# Patient Record
Sex: Male | Born: 1950
Health system: Southern US, Community
[De-identification: ages and names within clinical notes are randomized; demographics above are authoritative.]

## PROBLEM LIST (undated history)

## (undated) DIAGNOSIS — B070 Plantar wart: Secondary | ICD-10-CM

## (undated) DIAGNOSIS — M545 Low back pain, unspecified: Secondary | ICD-10-CM

## (undated) DIAGNOSIS — I6529 Occlusion and stenosis of unspecified carotid artery: Secondary | ICD-10-CM

## (undated) DIAGNOSIS — I712 Thoracic aortic aneurysm, without rupture: Secondary | ICD-10-CM

## (undated) DIAGNOSIS — I10 Essential (primary) hypertension: Secondary | ICD-10-CM

## (undated) DIAGNOSIS — R6882 Decreased libido: Secondary | ICD-10-CM

## (undated) DIAGNOSIS — M719 Bursopathy, unspecified: Secondary | ICD-10-CM

## (undated) DIAGNOSIS — Z136 Encounter for screening for cardiovascular disorders: Secondary | ICD-10-CM

## (undated) DIAGNOSIS — E7800 Pure hypercholesterolemia, unspecified: Secondary | ICD-10-CM

## (undated) DIAGNOSIS — J449 Chronic obstructive pulmonary disease, unspecified: Secondary | ICD-10-CM

## (undated) HISTORY — DX: Low back pain: M54.5

## (undated) HISTORY — DX: Encounter for screening for cardiovascular disorders: Z13.6

## (undated) HISTORY — DX: Occlusion and stenosis of unspecified carotid artery: I65.29

## (undated) HISTORY — DX: Chronic obstructive pulmonary disease, unspecified: J44.9

## (undated) HISTORY — DX: Low back pain, unspecified: M54.50

## (undated) HISTORY — DX: Pure hypercholesterolemia, unspecified: E78.00

## (undated) HISTORY — DX: Plantar wart: B07.0

## (undated) HISTORY — DX: Decreased libido: R68.82

## (undated) HISTORY — DX: Essential (primary) hypertension: I10

## (undated) HISTORY — DX: Bursopathy, unspecified: M71.9

## (undated) HISTORY — DX: Thoracic aortic aneurysm, without rupture: I71.2

---

## 2003-04-02 ENCOUNTER — Encounter: Admission: RE | Admit: 2003-04-02 | Discharge: 2003-04-02 | Payer: Self-pay | Admitting: Family Medicine

## 2003-04-02 ENCOUNTER — Encounter: Payer: Self-pay | Admitting: Family Medicine

## 2006-06-29 ENCOUNTER — Ambulatory Visit: Payer: Self-pay | Admitting: Family Medicine

## 2006-06-29 LAB — CONVERTED CEMR LAB
ALT: 24 units/L (ref 0–40)
AST: 21 units/L (ref 0–37)
BUN: 12 mg/dL (ref 6–23)
CO2: 29 meq/L (ref 19–32)
Calcium: 9.5 mg/dL (ref 8.4–10.5)
Chloride: 104 meq/L (ref 96–112)
Cholesterol: 245 mg/dL (ref 0–200)
Creatinine, Ser: 0.9 mg/dL (ref 0.4–1.5)
GFR calc Af Amer: 113 mL/min
GFR calc non Af Amer: 93 mL/min
Glucose, Bld: 86 mg/dL (ref 70–99)
HDL: 38.5 mg/dL — ABNORMAL LOW (ref >39.0)
LDL Cholesterol: 183 mg/dL — ABNORMAL HIGH (ref 0–99)
Potassium: 4.6 meq/L (ref 3.5–5.1)
Sodium: 140 meq/L (ref 135–145)
Total CHOL/HDL Ratio: 6.4
Triglycerides: 118 mg/dL (ref 0–149)
VLDL: 24 mg/dL (ref 0–40)

## 2006-08-03 ENCOUNTER — Ambulatory Visit: Payer: Self-pay | Admitting: Family Medicine

## 2006-08-03 LAB — CONVERTED CEMR LAB
BUN: 14 mg/dL (ref 6–23)
CO2: 30 meq/L (ref 19–32)
Calcium: 9.6 mg/dL (ref 8.4–10.5)
Chloride: 103 meq/L (ref 96–112)
Creatinine, Ser: 1 mg/dL (ref 0.4–1.5)
GFR calc Af Amer: 100 mL/min
GFR calc non Af Amer: 83 mL/min
Glucose, Bld: 105 mg/dL — ABNORMAL HIGH (ref 70–99)
Potassium: 4.6 meq/L (ref 3.5–5.1)
Sodium: 139 meq/L (ref 135–145)

## 2006-09-04 ENCOUNTER — Ambulatory Visit: Payer: Self-pay | Admitting: Family Medicine

## 2006-09-04 LAB — CONVERTED CEMR LAB
BUN: 18 mg/dL (ref 6–23)
CO2: 29 meq/L (ref 19–32)
Calcium: 9.5 mg/dL (ref 8.4–10.5)
Chloride: 105 meq/L (ref 96–112)
Creatinine, Ser: 0.9 mg/dL (ref 0.4–1.5)
GFR calc Af Amer: 113 mL/min
GFR calc non Af Amer: 93 mL/min
Glucose, Bld: 106 mg/dL — ABNORMAL HIGH (ref 70–99)
Potassium: 4.6 meq/L (ref 3.5–5.1)
Sodium: 142 meq/L (ref 135–145)

## 2006-10-03 ENCOUNTER — Ambulatory Visit: Payer: Self-pay | Admitting: Family Medicine

## 2006-10-03 LAB — CONVERTED CEMR LAB
ALT: 25 units/L (ref 0–40)
AST: 21 units/L (ref 0–37)
Cholesterol: 179 mg/dL (ref 0–200)
HDL: 38.2 mg/dL — ABNORMAL LOW (ref >39.0)
LDL Cholesterol: 117 mg/dL — ABNORMAL HIGH (ref 0–99)
Total CHOL/HDL Ratio: 4.7
Triglycerides: 117 mg/dL (ref 0–149)
VLDL: 23 mg/dL (ref 0–40)

## 2007-01-01 ENCOUNTER — Encounter (INDEPENDENT_AMBULATORY_CARE_PROVIDER_SITE_OTHER): Payer: Self-pay | Admitting: Family Medicine

## 2007-01-01 ENCOUNTER — Encounter: Payer: Self-pay | Admitting: Family Medicine

## 2007-01-01 ENCOUNTER — Ambulatory Visit: Payer: Self-pay | Admitting: Family Medicine

## 2007-01-01 DIAGNOSIS — I10 Essential (primary) hypertension: Secondary | ICD-10-CM | POA: Insufficient documentation

## 2007-01-01 DIAGNOSIS — E785 Hyperlipidemia, unspecified: Secondary | ICD-10-CM | POA: Insufficient documentation

## 2007-01-01 DIAGNOSIS — F17210 Nicotine dependence, cigarettes, uncomplicated: Secondary | ICD-10-CM | POA: Insufficient documentation

## 2007-03-20 ENCOUNTER — Ambulatory Visit: Payer: Self-pay | Admitting: Family Medicine

## 2007-03-22 ENCOUNTER — Telehealth (INDEPENDENT_AMBULATORY_CARE_PROVIDER_SITE_OTHER): Payer: Self-pay | Admitting: *Deleted

## 2007-03-22 ENCOUNTER — Encounter (INDEPENDENT_AMBULATORY_CARE_PROVIDER_SITE_OTHER): Payer: Self-pay | Admitting: *Deleted

## 2007-03-22 LAB — CONVERTED CEMR LAB
ALT: 22 units/L (ref 0–53)
AST: 21 units/L (ref 0–37)
BUN: 14 mg/dL (ref 6–23)
CO2: 32 meq/L (ref 19–32)
Calcium: 9.8 mg/dL (ref 8.4–10.5)
Chloride: 105 meq/L (ref 96–112)
Cholesterol: 196 mg/dL (ref 0–200)
Creatinine, Ser: 0.9 mg/dL (ref 0.4–1.5)
GFR calc Af Amer: 113 mL/min
GFR calc non Af Amer: 93 mL/min
Glucose, Bld: 99 mg/dL (ref 70–99)
HDL: 30.2 mg/dL — ABNORMAL LOW (ref >39.0)
LDL Cholesterol: 142 mg/dL — ABNORMAL HIGH (ref 0–99)
Potassium: 4.7 meq/L (ref 3.5–5.1)
Sodium: 143 meq/L (ref 135–145)
Total CHOL/HDL Ratio: 6.5
Triglycerides: 121 mg/dL (ref 0–149)
VLDL: 24 mg/dL (ref 0–40)

## 2007-04-11 ENCOUNTER — Telehealth (INDEPENDENT_AMBULATORY_CARE_PROVIDER_SITE_OTHER): Payer: Self-pay | Admitting: *Deleted

## 2007-05-17 ENCOUNTER — Telehealth (INDEPENDENT_AMBULATORY_CARE_PROVIDER_SITE_OTHER): Payer: Self-pay | Admitting: *Deleted

## 2007-06-27 ENCOUNTER — Ambulatory Visit: Payer: Self-pay | Admitting: Family Medicine

## 2007-06-27 DIAGNOSIS — F4322 Adjustment disorder with anxiety: Secondary | ICD-10-CM | POA: Insufficient documentation

## 2007-06-28 ENCOUNTER — Encounter (INDEPENDENT_AMBULATORY_CARE_PROVIDER_SITE_OTHER): Payer: Self-pay | Admitting: *Deleted

## 2007-06-28 LAB — CONVERTED CEMR LAB
ALT: 29 units/L (ref 0–53)
AST: 24 units/L (ref 0–37)
BUN: 21 mg/dL (ref 6–23)
CO2: 28 meq/L (ref 19–32)
Calcium: 9.6 mg/dL (ref 8.4–10.5)
Chloride: 100 meq/L (ref 96–112)
Cholesterol: 185 mg/dL (ref 0–200)
Creatinine, Ser: 1 mg/dL (ref 0.4–1.5)
GFR calc Af Amer: 100 mL/min
GFR calc non Af Amer: 82 mL/min
Glucose, Bld: 107 mg/dL — ABNORMAL HIGH (ref 70–99)
HDL: 35.5 mg/dL — ABNORMAL LOW (ref >39.0)
LDL Cholesterol: 135 mg/dL — ABNORMAL HIGH (ref 0–99)
Potassium: 4.3 meq/L (ref 3.5–5.1)
Sodium: 138 meq/L (ref 135–145)
Total CHOL/HDL Ratio: 5.2
Triglycerides: 75 mg/dL (ref 0–149)
VLDL: 15 mg/dL (ref 0–40)

## 2007-10-25 ENCOUNTER — Ambulatory Visit: Payer: Self-pay | Admitting: Internal Medicine

## 2007-10-30 ENCOUNTER — Encounter: Payer: Self-pay | Admitting: Internal Medicine

## 2007-11-06 ENCOUNTER — Encounter (INDEPENDENT_AMBULATORY_CARE_PROVIDER_SITE_OTHER): Payer: Self-pay | Admitting: *Deleted

## 2008-02-28 ENCOUNTER — Ambulatory Visit: Payer: Self-pay | Admitting: Internal Medicine

## 2008-08-11 ENCOUNTER — Ambulatory Visit: Payer: Self-pay | Admitting: Internal Medicine

## 2008-08-11 DIAGNOSIS — R059 Cough, unspecified: Secondary | ICD-10-CM | POA: Insufficient documentation

## 2008-08-11 DIAGNOSIS — R05 Cough: Secondary | ICD-10-CM

## 2008-08-14 LAB — CONVERTED CEMR LAB
ALT: 24 units/L (ref 0–53)
AST: 24 units/L (ref 0–37)
Albumin: 4.2 g/dL (ref 3.5–5.2)
Alkaline Phosphatase: 91 units/L (ref 39–117)
BUN: 15 mg/dL (ref 6–23)
Basophils Absolute: 0.1 10*3/uL (ref 0.0–0.1)
Basophils Relative: 0.9 % (ref 0.0–3.0)
Bilirubin, Direct: 0.1 mg/dL (ref 0.0–0.3)
CO2: 29 meq/L (ref 19–32)
Calcium: 9.5 mg/dL (ref 8.4–10.5)
Chloride: 101 meq/L (ref 96–112)
Cholesterol: 207 mg/dL (ref 0–200)
Creatinine, Ser: 0.8 mg/dL (ref 0.4–1.5)
Direct LDL: 150.3 mg/dL
Eosinophils Absolute: 0.2 10*3/uL (ref 0.0–0.7)
Eosinophils Relative: 1.5 % (ref 0.0–5.0)
GFR calc Af Amer: 129 mL/min
GFR calc non Af Amer: 106 mL/min
Glucose, Bld: 84 mg/dL (ref 70–99)
HCT: 46.6 % (ref 39.0–52.0)
HDL: 40.1 mg/dL (ref >39.0)
Hemoglobin: 16.2 g/dL (ref 13.0–17.0)
Lymphocytes Relative: 27.3 % (ref 12.0–46.0)
MCHC: 34.6 g/dL (ref 30.0–36.0)
MCV: 88 fL (ref 78.0–100.0)
Monocytes Absolute: 0.8 10*3/uL (ref 0.1–1.0)
Monocytes Relative: 7 % (ref 3.0–12.0)
Neutro Abs: 7 10*3/uL (ref 1.4–7.7)
Neutrophils Relative %: 63.3 % (ref 43.0–77.0)
Platelets: 261 10*3/uL (ref 150–400)
Potassium: 4.4 meq/L (ref 3.5–5.1)
RBC: 5.3 M/uL (ref 4.22–5.81)
RDW: 13.2 % (ref 11.5–14.6)
Sodium: 139 meq/L (ref 135–145)
Total Bilirubin: 0.7 mg/dL (ref 0.3–1.2)
Total CHOL/HDL Ratio: 5.2
Total Protein: 7.6 g/dL (ref 6.0–8.3)
Triglycerides: 97 mg/dL (ref 0–149)
VLDL: 19 mg/dL (ref 0–40)
WBC: 11.1 10*3/uL — ABNORMAL HIGH (ref 4.5–10.5)

## 2008-09-03 ENCOUNTER — Telehealth (INDEPENDENT_AMBULATORY_CARE_PROVIDER_SITE_OTHER): Payer: Self-pay | Admitting: *Deleted

## 2008-12-22 ENCOUNTER — Ambulatory Visit: Payer: Self-pay | Admitting: Internal Medicine

## 2009-01-20 ENCOUNTER — Telehealth (INDEPENDENT_AMBULATORY_CARE_PROVIDER_SITE_OTHER): Payer: Self-pay | Admitting: *Deleted

## 2009-07-06 ENCOUNTER — Encounter: Admission: RE | Admit: 2009-07-06 | Discharge: 2009-07-06 | Payer: Self-pay | Admitting: Internal Medicine

## 2010-07-11 LAB — CONVERTED CEMR LAB
BUN: 16 mg/dL (ref 6–23)
Cholesterol, target level: 200 mg/dL
Creatinine, Ser: 0.8 mg/dL (ref 0.4–1.5)
HDL goal, serum: 40 mg/dL
LDL Goal: 130 mg/dL
Potassium: 4.4 meq/L (ref 3.5–5.1)

## 2010-07-13 NOTE — Assessment & Plan Note (Signed)
Summary: roa 3 months.cbs   Vital Signs:  Patient Profile:   60 Years Old Male Weight:      189.13 pounds Temp:     98.7 degrees F oral Pulse rate:   72 / minute Resp:     16 per minute BP sitting:   110 / 70  (right arm)  Pt. in pain?   no  Vitals Entered By: Ardyth Man (June 27, 2007 9:04 AM)                  Chief Complaint:  3 month check up BP and Cholesterol.  History of Present Illness: Patient reports he is having. a lot  "anxiety attacks." Dealing with his mother chronic illness. Very stressful life,: Business doing poorly, eating poorly ( 1 meal a day), and traveling to see his mother almost daily. States he suddenly get sense of "anxiety" that overwhelms him. No presyncope, syncopal episodes, chest pain, dizziness, weakness, palpitation, seizure activity.  Last a few seconds and it goes away.  Usually preceded by a stressful situation. It has not happen in several weeks. States  his wife advise him to bring it up, but he doesn't want treatment. States he knows what is causing it.  Hypertension Follow-Up      This is a 60 year old man who presents for Hypertension follow-up.  The patient denies lightheadedness, edema, and fatigue.  The patient denies the following associated symptoms: chest pain, chest pressure, dyspnea, palpitations, syncope, leg edema, and pedal edema.  Compliance with medications (by patient report) has been near 100%.  The patient reports that dietary compliance has been good.  The patient reports no exercise.    Hyperlipidemia Follow-Up      The patient also presents for Hyperlipidemia follow-up.  Compliance with medications (by patient report) has been near 100%.    Current Allergies: ! SULFA  Past Medical History:    Reviewed history from 01/01/2007 and no changes required:       Hyperlipidemia       Hypertension   Social History:    Reviewed history from 01/01/2007 and no changes required:       Retired       Married  Current Smoker   Risk Factors:     Counseled to quit/cut down tobacco use:  yes   Review of Systems  Psych      Complains of anxiety and panic attacks.      Denies alternate hallucination ( auditory/visual), suicidal thoughts/plans, thoughts of violence, unusual visions or sounds, and thoughts /plans of harming others.   Physical Exam  General:     Well-developed,well-nourished,in no acute distress; alert,appropriate and cooperative throughout examination Neck:     No deformities, masses, or tenderness noted. Lungs:     Normal respiratory effort, chest expands symmetrically. Lungs are clear to auscultation, no crackles or wheezes. Heart:     Normal rate and regular rhythm. S1 and S2 normal without gallop, murmur, click, rub or other extra sounds. Pulses:     R and L carotid,radial ,dorsalis pedis and posterior tibial pulses are full and equal bilaterally Extremities:     No clubbing, cyanosis, edema, or deformity noted with normal full range of motion of all joints.      Impression & Recommendations:  Problem # 1:  HYPERTENSION (ICD-401.9) At goal F/u in 4 months His updated medication list for this problem includes:    Hydrochlorothiazide 25 Mg Tabs (Hydrochlorothiazide) .Marland Kitchen... Take 1 tablet by mouth every  morning    Lisinopril 10 Mg Tabs (Lisinopril) .Marland Kitchen... Take one tablet daily  Orders: TLB-BMP (Basic Metabolic Panel-BMET) (80048-METABOL) Venipuncture (62130)  BP today: 110/70 Prior BP: 110/74 (03/20/2007)  Labs Reviewed: Creat: 0.9 (03/20/2007) Chol: 196 (03/20/2007)   HDL: 30.2 (03/20/2007)   LDL: 142 (03/20/2007)   TG: 121 (03/20/2007)   Problem # 2:  HYPERLIPIDEMIA (ICD-272.4) Previously at goal His updated medication list for this problem includes:    Zocor 40 Mg Tabs (Simvastatin) .Marland Kitchen... Take one tablet daily  Orders: TLB-Lipid Panel (80061-LIPID) TLB-AST (SGOT) (84450-SGOT) TLB-ALT (SGPT) (84460-ALT)  Labs Reviewed: Chol: 196 (03/20/2007)   HDL:  30.2 (03/20/2007)   LDL: 142 (03/20/2007)   TG: 121 (03/20/2007) SGOT: 21 (03/20/2007)   SGPT: 22 (03/20/2007)   Problem # 3:  ADJUSTMENT DISORDER WITH ANXIETY (ICD-309.24) Advise patient that I recommend therapy and/or medication management. Patient again reiterated that he only brought it up because of his wife and will try to change his lifestyle to see if it improves. Will let me know. Aware of my concerns as well as his wife's.  Complete Medication List: 1)  Hydrochlorothiazide 25 Mg Tabs (Hydrochlorothiazide) .... Take 1 tablet by mouth every morning 2)  Lisinopril 10 Mg Tabs (Lisinopril) .... Take one tablet daily 3)  Zocor 40 Mg Tabs (Simvastatin) .... Take one tablet daily 4)  Adprin B 325 Mg Tabs (Aspirin buf(cacarb-mgcarb-mgo))     ]  Preventive Care Screening     Patient continues to decline colon and prostate screen. Aware of risks.

## 2010-07-13 NOTE — Progress Notes (Signed)
Summary: ALEJANDRO-REFILL HYDROCHLOROTHIAZIDE 25MG   Phone Note Refill Request   Refills Requested: Medication #1:  HYDROCHLOROTHIAZIDE 25 MG TABS Take 1 tablet by mouth every morning  Medication #2:  LISINOPRIL 10 MG  TABS take one tablet daily RECD BY FAX [facility] [address] [phone] LAST FILLED #30 ON 10.29.08  Initial call taken by: Gwen Pounds,  May 17, 2007 9:47 AM      Prescriptions: LISINOPRIL 10 MG  TABS (LISINOPRIL) take one tablet daily  #30 x 3   Entered by:   Ardyth Man   Authorized by:   Leanne Chang MD   Signed by:   Ardyth Man on 05/17/2007   Method used:   Electronically sent to ...       [id number]       [address] [place], [place]  [address]       Ph: [phone]       Fax: [phone]   RxID:   [id number] HYDROCHLOROTHIAZIDE 25 MG TABS (HYDROCHLOROTHIAZIDE) Take 1 tablet by mouth every morning  #30 x 3   Entered by:   Ardyth Man   Authorized by:   Leanne Chang MD   Signed by:   Ardyth Man on 05/17/2007   Method used:   Electronically sent to ...       [id number]*       [address] [place], [place]  [address]       Ph: [phone]       Fax: [phone]   RxID:   [id number]

## 2010-07-13 NOTE — Assessment & Plan Note (Signed)
Summary: f/u rescheduled from December 12, 2006   Vital Signs:  Patient Profile:   60 Years Old Male Weight:      186 pounds Pulse rate:   66 / minute BP sitting:   118 / 76  (left arm)  Pt. in pain?   no  Vitals Entered By: Doristine Devoid (January 01, 2007 12:23 PM)                Chief Complaint:  3 Month follow-up.  History of Present Illness:  Hypertension Follow-Up      This is a 60 year old man who presents for Hypertension follow-up.  The patient denies lightheadedness, urinary frequency, rash, and fatigue.  Associated symptoms include pedal edema.  The patient denies the following associated symptoms: chest pain, chest pressure, exercise intolerance, and dyspnea.  Compliance with medications (by patient report) has been near 100%.  The patient reports that dietary compliance has been good.  The patient reports exercising 3-4X per week.  Patient very physically active due to job. 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 Revealed he is smoking 3ppd. Not committed to quiting yet.  Hyperlipidemia Follow-Up      The patient also presents for Hyperlipidemia follow-up.  The patient denies muscle aches, diarrhea, and fatigue.  The patient denies the following symptoms: chest pain/pressure.  Compliance with medications (by patient report) has been near 100%.  Dietary compliance has been good.     Patient continues to decline preventive screening.Aware of risks    Past Medical History:    Hyperlipidemia    Hypertension   Social History:    Retired    Married    Current Smoker   Risk Factors:  Tobacco use:  current    Physical Exam  General:     alert and well-hydrated.   Neck:     No deformities, masses, or tenderness noted. Lungs:     Normal respiratory effort, chest expands symmetrically. Lungs are clear to auscultation, no crackles or wheezes. Heart:     Normal rate and regular rhythm.  S1 and S2 normal without gallop, murmur, click, rub or other extra sounds. Extremities:     No clubbing, cyanosis, edema, or deformity noted with normal full range of motion of all joints.      Impression & Recommendations:  Problem # 1:  HYPERTENSION (ICD-401.9) At goal His updated medication list for this problem includes:    Hydrochlorothiazide 25 Mg Tabs (Hydrochlorothiazide) .Marland Kitchen... Take 1 tablet by mouth every morning    Lisinopril 10 Mg Tabs (Lisinopril) .Marland Kitchen... Take one tablet daily  BP today: 118/76  Labs Reviewed: Creat: 0.9 (09/04/2006) Chol: 179 (10/03/2006)   HDL: 38.2 (10/03/2006)   LDL: 117 (10/03/2006)   TG: 117 (10/03/2006)   Problem # 2:  HYPERLIPIDEMIA (ICD-272.4) At goal His updated medication list for this problem includes:    Lipitor 40 Mg Tabs (Atorvastatin calcium)  Labs Reviewed: Chol: 179 (10/03/2006)   HDL: 38.2 (10/03/2006)   LDL: 117 (10/03/2006)   TG: 117 (10/03/2006) SGOT: 21 (10/03/2006)   SGPT: 25 (10/03/2006)   Problem # 3:  TOBACCO USE (ICD-305.1) Encouraged smoking cessation and discussed different methods for smoking cessation.    Patient Instructions: 1)  Please schedule a follow-up appointment in 3 months. 2)  Stop Smoking Tips: Choose a Quit date. Cut down before the Quit date. decide what you will do as a substitute when  you feel the urge to smoke(gum,toothpick,exercise). 3)  It is important that you exercise regularly at least 20 minutes 5 times a week. If you develop chest pain, have severe difficulty breathing, or feel very tired , stop exercising immediately and seek medical attention.

## 2010-07-13 NOTE — Progress Notes (Signed)
  Phone Note Outgoing Call Call back at [facility] Phone [phone]   Call placed by: Ardyth Man,  March 22, 2007 12:50 PM Call placed to: Patient Summary of Call: [facility] and mailed lab report. ...................................................................Ardyth Man  March 22, 2007 12:50 PM

## 2010-07-13 NOTE — Letter (Signed)
Summary: Results Follow-up Letter  [place] at [facility]  [address] [place], [place] [address]   Phone: [phone]  Fax: [phone]    03/22/2007        Brendin Situ [address] [place], [place]  [address]  Dear Mr. Hailes,   The following are the results of your recent test(s):  Test     Result     Pap Smear    Normal_______  Not Normal_____       Comments: _________________________________________________________ Cholesterol LDL(Bad cholesterol):          Your goal is less than:         HDL (Good cholesterol):        Your goal is more than: _________________________________________________________ Other Tests:   _________________________________________________________  Please call for an appointment Or _Please see attached.________________________________________________________ _________________________________________________________ _________________________________________________________  Sincerely,  Ardyth Man [place] at [facility]

## 2010-07-13 NOTE — Progress Notes (Signed)
Summary: paz----rx  Phone Note Refill Request   Refills Requested: Medication #1:  SIMVASTATIN 40 MG TABS 1 by mouth at bedtime   Last Refilled: 08/01/2008 [facility] [address]--[phone]  Initial call taken by: Freddy Jaksch,  September 03, 2008 4:21 PM      Prescriptions: SIMVASTATIN 40 MG TABS (SIMVASTATIN) 1 by mouth at bedtime  #30 x 6   Entered by:   Kandice Hams   Authorized by:   Nolon Rod. Paz MD   Signed by:   Kandice Hams on 09/04/2008   Method used:   Faxed to ...       [facility] [phone]* (retail)       [address] [place], [place]  [address]       Ph: [phone]       Fax: [phone]   RxID:   [id number]

## 2010-07-13 NOTE — Progress Notes (Signed)
Summary: ALEJANDRO-REFILL SIMVASTATIN 40MG   Phone Note Refill Request   Refills Requested: Medication #1:  SIMVASTATIN 40MG  TAKE 1 TABLET BY MOUTH EVERY DAY RECD BY FAX [facility] [address] [phone] LAST FILLED #30 ON 9.30.08  Initial call taken by: Gwen Pounds,  April 11, 2007 12:15 PM      Prescriptions: ZOCOR 40 MG  TABS (SIMVASTATIN) Take one tablet daily  #30 x 3   Entered by:   Ardyth Man   Authorized by:   Leanne Chang MD   Signed by:   Ardyth Man on 04/11/2007   Method used:   Electronically sent to ...       [facility]  [id number]*       [address] [place], [place]  [address]       Ph: [phone]       Fax: [phone]   RxID:   [id number]

## 2010-07-13 NOTE — Letter (Signed)
Summary: Results Follow-up Letter  [place] at [facility]  [address] [place], [place] [address]   Phone: [phone]  Fax: [phone]    06/28/2007        [facility] [phone] [address] [place], [place]  [address]  Dear Timothy Maldonado,   The following are the results of your recent test(s):  Test     Result     Pap Smear    Normal_______  Not Normal_____       Comments: _________________________________________________________ Cholesterol LDL(Bad cholesterol):          Your goal is less than:         HDL (Good cholesterol):        Your goal is more than: _________________________________________________________ Other Tests:   _________________________________________________________  Please call for an appointment Or Please see attached._________________________________________________________ _________________________________________________________ _________________________________________________________  Sincerely,  Ardyth Man [place] at [facility]

## 2010-07-13 NOTE — Miscellaneous (Signed)
Summary: Waiver of Liability  Waiver of Liability   Imported By: Freddy Jaksch 10/25/2007 15:26:28  _____________________________________________________________________  External Attachment:    Type:   Image     Comment:   External Document

## 2010-07-13 NOTE — Assessment & Plan Note (Signed)
Summary: Timothy Maldonado 4 months,cbs   Vital Signs:  Patient Profile:   60 Years Old Male Weight:      181.25 pounds Pulse rate:   60 / minute Pulse rhythm:   regular Resp:     14 per minute BP sitting:   110 / 74  (left arm) Cuff size:   large  Vitals Entered By: Wendall Stade (Oct 25, 2007 10:35 AM)                 Chief Complaint:  four month follow up.  History of Present Illness: Liupids from 1/08 - 1/09 reviewed; LDL was 183 pre meds & 117 on Lipitor.On Simvastatiin 40 LDL has been 142 & 135. Optimal LDL TBD.  No diet; CVE as yardwork.  Lipid Management History:      Positive NCEP/ATP III risk factors include male age 44 years old or older, HDL cholesterol less than 40, current tobacco user, and hypertension.  Negative NCEP/ATP III risk factors include non-diabetic, no family history for ischemic heart disease, no ASHD (atherosclerotic heart disease), no prior stroke/TIA, no peripheral vascular disease, and no history of aortic aneurysm.       Current Allergies (reviewed today): ! SULFA  Past Medical History:    Reviewed history from 01/01/2007 and no changes required:       Hyperlipidemia       Hypertension       Current Problems:        ADJUSTMENT DISORDER WITH ANXIETY (ICD-309.24)       TOBACCO USE (ICD-305.1)       HYPERTENSION (ICD-401.9)       HYPERLIPIDEMIA (ICD-272.4)  Past Surgical History:    Reviewed history and no changes required:       Denies surgical history   Family History:    Reviewed history and no changes required:       father CAD       mother HTN       brother HTN,CAD       daughter CVA  Social History:    Reviewed history from 01/01/2007 and no changes required:       Retired       Married       Current Smoker    Review of Systems  CV      Complains of shortness of breath with exertion.      Denies chest pain or discomfort, difficulty breathing at night, difficulty breathing while lying down, leg cramps with exertion, swelling of  feet, and swelling of hands.      DOE with stairs  Resp      Denies cough and sputum productive.   Physical Exam  General:     Well-developed,well-nourished,in no acute distress; alert,appropriate and cooperative throughout examination Mouth:     Slightly hoarse Lungs:     Normal respiratory effort, chest expands symmetrically. Lungs are clear to auscultation, no crackles or wheezes.Decreased BS Heart:     Normal rate and regular rhythm. S1 and S2 normal without gallop, murmur, click, rub or other extra sounds. Pulses:     R and L carotid,radial,dorsalis pedis and posterior tibial pulses are full and equal bilaterally    Impression & Recommendations:  Problem # 1:  HYPERLIPIDEMIA (ICD-272.4)  His updated medication list for this problem includes:    Zocor 40 Mg Tabs (Simvastatin) .Marland Kitchen... Take one tablet daily  Orders: T- * Misc. Laboratory test [phone])   Problem # 2:  HYPERTENSION (ICD-401.9) controlled His updated medication  list for this problem includes:    Hydrochlorothiazide 25 Mg Tabs (Hydrochlorothiazide) .Marland Kitchen... Take 1 tablet by mouth every morning    Lisinopril 10 Mg Tabs (Lisinopril) .Marland Kitchen... Take one tablet daily  Orders: TLB-Creatinine, Blood (82565-CREA) TLB-BUN (Urea Nitrogen) (84520-BUN) TLB-Potassium (K+) (84132-K)   Problem # 3:  TOBACCO USE (ICD-305.1)  Orders: Tobacco use cessation intermediate 3-10 minutes (99406) T-2 View CXR (71020TC)   Complete Medication List: 1)  Hydrochlorothiazide 25 Mg Tabs (Hydrochlorothiazide) .... Take 1 tablet by mouth every morning 2)  Lisinopril 10 Mg Tabs (Lisinopril) .... Take one tablet daily 3)  Zocor 40 Mg Tabs (Simvastatin) .... Take one tablet daily 4)  Ibuprofen Prn   Lipid Assessment/Plan:      Based on NCEP/ATP III, the patient's risk factor category is "2 or more risk factors and a calculated 10 year CAD risk of < 20%".  From this information, the patient's calculated lipid goals are as follows: Total  cholesterol goal is 200; LDL cholesterol goal is 130; HDL cholesterol goal is 40; Triglyceride goal is 150.     Patient Instructions: 1)  Please review LDL article   Prescriptions: ZOCOR 40 MG  TABS (SIMVASTATIN) Take one tablet daily  #90 x 1   Entered and Authorized by:   Marga Melnick MD   Signed by:   Marga Melnick MD on 10/25/2007   Method used:   Print then Give to Patient   RxID:   [id number] LISINOPRIL 10 MG  TABS (LISINOPRIL) take one tablet daily  #90 x 1   Entered and Authorized by:   Marga Melnick MD   Signed by:   Marga Melnick MD on 10/25/2007   Method used:   Print then Give to Patient   RxID:   [id number] HYDROCHLOROTHIAZIDE 25 MG TABS (HYDROCHLOROTHIAZIDE) Take 1 tablet by mouth every morning  #90 x 1   Entered and Authorized by:   Marga Melnick MD   Signed by:   Marga Melnick MD on 10/25/2007   Method used:   Print then Give to Patient   RxID:   [id number]  ]

## 2010-07-13 NOTE — Assessment & Plan Note (Signed)
Summary: for med refill//ph   Vital Signs:  Patient Profile:   60 Years Old Male Weight:      178.4 pounds Pulse rate:   72 / minute Pulse rhythm:   regular BP sitting:   110 / 70  (left arm) Cuff size:   large  Vitals Entered By: Shary Decamp (August 11, 2008 10:47 AM)                 Chief Complaint:  rov .  History of Present Illness: ROV Hyperlipidemia-- today pt states he takes  zocor  (x> 1 year), no s/e   Hypertension-- ambulatory BPs 120s  ANXIETY -- overall better  TOBACCO USE -- should I have a CXR? pt is concern    Updated Prior Medication List: HYDROCHLOROTHIAZIDE 25 MG TABS (HYDROCHLOROTHIAZIDE) Take 1 tablet by mouth every morning LISINOPRIL 10 MG  TABS (LISINOPRIL) take one tablet daily * IBUPROFEN PRN  SIMVASTATIN 40 MG TABS (SIMVASTATIN) 1 by mouth at bedtime  Current Allergies (reviewed today): ! SULFA  Past Medical History:    Hyperlipidemia    Hypertension    ADJUSTMENT DISORDER WITH ANXIETY (ICD-309.24)    TOBACCO USE    Family History:    Reviewed history from 10/25/2007 and no changes required:       father CAD       mother HTN       brother HTN,CAD       daughter CVA  Social History:    Reviewed history from 01/01/2007 and no changes required:       Retired       Married       Current Smoker    Review of Systems  General      Denies fever and weight loss.      not taking an ASA , no reason  CV      Denies chest pain or discomfort and swelling of feet.  Resp      Denies coughing up blood and wheezing.      had a cold, still has a cough but getting better  very active, hard physical work ----> no DOE   Physical Exam  General:     alert and well-developed.   Neck:     no masses.   Lungs:     normal respiratory effort, no intercostal retractions, no accessory muscle use, and normal breath sounds.   (w/ cough has some audible chest congestion) Heart:     normal rate, regular rhythm, no murmur, and no gallop.    Extremities:     no pretibial edema bilaterally  Psych:     Cognition and judgment appear intact. Alert and cooperative with normal attention span and concentration.      Impression & Recommendations:  Problem # 1:  ADJUSTMENT DISORDER WITH ANXIETY (ICD-309.24) better   Problem # 2:  TOBACCO USE (ICD-305.1) counseled about quiting ! no symptoms (so far) of COPD O2 sat 96 @ rest get CXR (COUGHING SOME) , patient understood limitations of CXR as far as detecting cancer  Orders: Tobacco use cessation intermediate 3-10 minutes (62952)   Problem # 3:  HYPERTENSION (ICD-401.9)  His updated medication list for this problem includes:    Hydrochlorothiazide 25 Mg Tabs (Hydrochlorothiazide) .Marland Kitchen... Take 1 tablet by mouth every morning    Lisinopril 10 Mg Tabs (Lisinopril) .Marland Kitchen... Take one tablet daily  Orders: Venipuncture (84132) TLB-CBC Platelet - w/Differential (85025-CBCD) TLB-BMP (Basic Metabolic Panel-BMET) (80048-METABOL)  BP today: 110/70 Prior  BP: 140/82 (02/28/2008)  Labs Reviewed: Creat: 0.8 (10/25/2007) Chol: 185 (06/27/2007)   HDL: 35.5 (06/27/2007)   LDL: 135 (06/27/2007)   TG: 75 (06/27/2007)   Problem # 4:  HYPERLIPIDEMIA (ICD-272.4) today the patient states he has been on zocor x > 1 year (last OV I did not know that) labs   His updated medication list for this problem includes:    Simvastatin 40 Mg Tabs (Simvastatin) .Marland Kitchen... 1 by mouth at bedtime  Orders: TLB-Lipid Panel (80061-LIPID) TLB-Hepatic/Liver Function Pnl (80076-HEPATIC)  Labs Reviewed: Chol: 185 (06/27/2007)   HDL: 35.5 (06/27/2007)   LDL: 135 (06/27/2007)   TG: 75 (06/27/2007) SGOT: 24 (06/27/2007)   SGPT: 29 (06/27/2007)  Lipid Goals: Chol Goal: 200 (10/25/2007)   HDL Goal: 40 (10/25/2007)   LDL Goal: 130 (10/25/2007)   TG Goal: 150 (10/25/2007)    Problem # 5:  HEALTH SCREENING (ICD-V70.0) DUE FOR CPX , PT AWARE patient does not like to have one done, explained reasons to have it done    Complete Medication List: 1)  Hydrochlorothiazide 25 Mg Tabs (Hydrochlorothiazide) .... Take 1 tablet by mouth every morning 2)  Lisinopril 10 Mg Tabs (Lisinopril) .... Take one tablet daily 3)  Simvastatin 40 Mg Tabs (Simvastatin) .Marland Kitchen.. 1 by mouth at bedtime 4)  Ibuprofen Prn  5)  Aspirin Adult Low Strength 81 Mg Tbec (Aspirin) .Marland Kitchen.. 1 a day  Other Orders: T-2 View CXR (71020TC)   Patient Instructions: 1)  take  an aspirin every day 2)  Please schedule a PHYSICAL  in 4 months.

## 2010-07-13 NOTE — Assessment & Plan Note (Signed)
Summary: 3 MONTH ROA///SPH   Vital Signs:  Patient Profile:   61 Years Old Male Weight:      185.50 pounds Temp:     98.2 degrees F oral Pulse rate:   72 / minute Resp:     16 per minute BP sitting:   110 / 74  (right arm)  Pt. in pain?   no  Vitals Entered By: Ardyth Man (March 20, 2007 10:33 AM)                  Chief Complaint:  3 month check.  History of Present Illness:  Hypertension Follow-Up      This is a 60 year old man who presents for Hypertension follow-up.  The patient denies lightheadedness and urinary frequency.  The patient denies the following associated symptoms: chest pain, dyspnea, palpitations, and leg edema.  Compliance with medications (by patient report) has been near 100%.  The patient reports that dietary compliance has been fair.    Hyperlipidemia Follow-Up      The patient also presents for Hyperlipidemia follow-up.  The patient denies muscle aches.  The patient denies the following symptoms: chest pain/pressure, exercise intolerance, and dypsnea.  Compliance with medications (by patient report) has been near 100%.          Physical Exam  General:     Well-developed,well-nourished,in no acute distress; alert,appropriate and cooperative throughout examination Neck:     No deformities, masses, or tenderness noted. Lungs:     Normal respiratory effort, chest expands symmetrically. Lungs are clear to auscultation, no crackles or wheezes. Heart:     Normal rate and regular rhythm. S1 and S2 normal without gallop, murmur, click, rub or other extra sounds. Extremities:     No clubbing, cyanosis, edema, or deformity noted with normal full range of motion of all joints.      Impression & Recommendations:  Problem # 1:  HYPERTENSION (ICD-401.9) at goal His updated medication list for this problem includes:    Hydrochlorothiazide 25 Mg Tabs (Hydrochlorothiazide) .Marland Kitchen... Take 1 tablet by mouth every morning    Lisinopril 10 Mg Tabs  (Lisinopril) .Marland Kitchen... Take one tablet daily F/u in 4-6 month and as needed   BP today: 110/74 Prior BP: 118/76 (01/01/2007)  Labs Reviewed: Creat: 0.9 (09/04/2006) Chol: 179 (10/03/2006)   HDL: 38.2 (10/03/2006)   LDL: 117 (10/03/2006)   TG: 117 (10/03/2006)  Orders: TLB-BMP (Basic Metabolic Panel-BMET) (80048-METABOL)   Problem # 2:  HYPERLIPIDEMIA (ICD-272.4) Previously at goal The following medications were removed from the medication list:    Lipitor 40 Mg Tabs (Atorvastatin calcium)  His updated medication list for this problem includes:    Zocor 40 Mg Tabs (Simvastatin) .Marland Kitchen... Take one tablet daily  Labs Reviewed: Chol: 179 (10/03/2006)   HDL: 38.2 (10/03/2006)   LDL: 117 (10/03/2006)   TG: 117 (10/03/2006) SGOT: 21 (10/03/2006)   SGPT: 25 (10/03/2006)  Orders: TLB-Lipid Panel (80061-LIPID) TLB-ALT (SGPT) (84460-ALT) TLB-AST (SGOT) (84450-SGOT)   Complete Medication List: 1)  Hydrochlorothiazide 25 Mg Tabs (Hydrochlorothiazide) .... Take 1 tablet by mouth every morning 2)  Lisinopril 10 Mg Tabs (Lisinopril) .... Take one tablet daily 3)  Zocor 40 Mg Tabs (Simvastatin) .... Take one tablet daily 4)  Adprin B 325 Mg Tabs (Aspirin buf(cacarb-mgcarb-mgo))     ]  Preventive Care Screening     Aware risks.

## 2010-10-29 NOTE — Assessment & Plan Note (Signed)
[facility]                        [place] OFFICE NOTE   Timothy Maldonado, Timothy Maldonado                      MRN:          [id number]  DATE:06/29/2006                            DOB:          [birth year]    REASON FOR VISIT:  Establish care.   Timothy Maldonado is a 60 year old male establishing care from [place]  physicians at [facility].  He has a history of hypertension and  hyperlipidemia, but has not taken medicines since before Thanksgiving.  He was previously on hydrochlorothiazide 25 mg and Lipitor 40 mg.  He  reports that his blood pressure has been running okay except for the  last couple days.  He has no specific complaints today, but does state  that he is okay with restarting the medication if needs to.   PAST MEDICAL HISTORY:  1. Hypertension.  2. Hyperlipidemia.  3. Hyperglycemia.   SURGERIES:  None.   ALLERGIES:  SULFA.   MEDICATIONS:  1. Hydrochlorothiazide previously.  2. Lipitor 40 mg previously.   FAMILY HISTORY:  Father passed away at the age of 62 secondary to a  myocardial infarction.  Mother alive with history of anemia.  Brother  alive with a history of hypertension and coronary artery disease.  He  has a daughter who had a stroke.   SOCIAL HISTORY:  The patient is married.  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  He smokes 3 packs per day.  Drinks alcohol  occasionally.  Denies any drug use.   REVIEW OF SYSTEMS:  As per HPI; otherwise unremarkable.   No health maintenance issues.  The patient declined colonoscopy, PSA and  rectal exam.  He is aware of risks.   OBJECTIVE:  VITAL SIGNS:  Weight 190.  Temperature 98.4, pulse 72, blood  pressure 138/90.  GENERAL:  We have a pleasant male in no acute distress.  Answers  questions appropriately.  Alert and oriented x3.  NECK:  Supple.  No lymphadenopathy, carotid bruits or JVD.  No  thyromegaly.  LUNGS:  Clear.  HEART:  Regular rate and rhythm.  Normal S1 and S2.   No murmurs,  gallops, or rubs on examination.  EXTREMITIES:  No clubbing, cyanosis, or edema.   IMPRESSION:  1. Hypertension.  2. Hyperlipidemia.  3. Hyperglycemia.   PLAN:  1. The patient did agree to restart hydrochlorothiazide, which will be      called in to [facility] through [facility].  2. I did provide samples of Lipitor 40 mg.  I advised the patient that      we will adjust the dose accordingly after review of the labs in the      next 2-3 days.  3. The patient is to followup with me in 1 month to reassess his blood      pressure or sooner if he has any concerns.     Leanne Chang, M.D.  Electronically Signed    LA/MedQ  DD: 06/29/2006  DT: 06/29/2006  Job #: [id number]

## 2013-11-27 ENCOUNTER — Other Ambulatory Visit: Payer: Self-pay | Admitting: Internal Medicine

## 2013-11-27 DIAGNOSIS — I6529 Occlusion and stenosis of unspecified carotid artery: Secondary | ICD-10-CM

## 2013-12-04 ENCOUNTER — Ambulatory Visit
Admission: RE | Admit: 2013-12-04 | Discharge: 2013-12-04 | Disposition: A | Discharge status: Home / Self Care | Payer: 59 | Source: Ambulatory Visit | Attending: Internal Medicine | Admitting: Internal Medicine

## 2013-12-04 DIAGNOSIS — I6529 Occlusion and stenosis of unspecified carotid artery: Secondary | ICD-10-CM

## 2015-01-12 ENCOUNTER — Other Ambulatory Visit (HOSPITAL_COMMUNITY): Payer: Self-pay | Admitting: Respiratory Therapy

## 2015-01-12 DIAGNOSIS — J439 Emphysema, unspecified: Secondary | ICD-10-CM

## 2015-01-22 ENCOUNTER — Encounter (HOSPITAL_COMMUNITY): Payer: Self-pay

## 2015-01-22 ENCOUNTER — Ambulatory Visit (HOSPITAL_COMMUNITY)
Admission: RE | Admit: 2015-01-22 | Discharge: 2015-01-22 | Disposition: A | Discharge status: Home / Self Care | Payer: Commercial Managed Care - HMO | Source: Ambulatory Visit | Attending: Internal Medicine | Admitting: Internal Medicine

## 2015-01-22 DIAGNOSIS — J439 Emphysema, unspecified: Secondary | ICD-10-CM | POA: Insufficient documentation

## 2015-01-22 LAB — PULMONARY FUNCTION TEST
DL/VA % pred: 63 %
DL/VA: 2.89 ml/min/mmHg/L
DLCO unc % pred: 54 %
DLCO unc: 16.89 ml/min/mmHg
FEF 25-75 Pre: 0.26 L/sec
FEF2575-%Pred-Pre: 9 %
FEV1-%Pred-Pre: 34 %
FEV1-Pre: 1.17 L
FEV1FVC-%Pred-Pre: 46 %
FEV6-%Pred-Pre: 55 %
FEV6-Pre: 2.38 L
FEV6FVC-%Pred-Pre: 75 %
FVC-%Pred-Pre: 73 %
FVC-Pre: 3.32 L
Pre FEV1/FVC ratio: 35 %
Pre FEV6/FVC Ratio: 72 %
RV % pred: 143 %
RV: 3.24 L
TLC % pred: 105 %
TLC: 7.2 L

## 2015-01-26 ENCOUNTER — Ambulatory Visit (INDEPENDENT_AMBULATORY_CARE_PROVIDER_SITE_OTHER): Payer: Commercial Managed Care - HMO | Admitting: Internal Medicine

## 2015-01-26 ENCOUNTER — Encounter: Payer: Self-pay | Admitting: Internal Medicine

## 2015-01-26 VITALS — BP 130/64 | HR 78 | Ht 69.0 in | Wt 135.2 lb

## 2015-01-26 DIAGNOSIS — Z720 Tobacco use: Secondary | ICD-10-CM

## 2015-01-26 DIAGNOSIS — J449 Chronic obstructive pulmonary disease, unspecified: Secondary | ICD-10-CM | POA: Diagnosis not present

## 2015-01-26 DIAGNOSIS — F17210 Nicotine dependence, cigarettes, uncomplicated: Secondary | ICD-10-CM

## 2015-01-26 NOTE — Patient Instructions (Addendum)
Plan A = Automatic = spiriva 2 puffs each am  Plan B= Backup - Only use your albuterol (proair - RED) as a rescue medication to be used if you can't catch your breath by resting or doing a relaxed purse lip breathing pattern.  - The less you use it, the better it will work when you need it. - Ok to use up to 2 puffs  every 4 hours if you must but call for immediate appointment if use goes up over your usual need - Don't leave home without it !!  (think of it like the spare tire for your car)    If you are satisfied with your treatment plan,  let your doctor know and he/she can either refill your medications or you can return here when your prescription runs out.     If in any way you are not 100% satisfied,  please tell us.  If 100% better, tell your friends!  Pulmonary follow up is as needed

## 2015-01-26 NOTE — Progress Notes (Signed)
   Subjective:    Patient ID: Timothy Maldonado, male    DOB: 11/08/1950,    MRN: [id number]  HPI  69 yowm active smoker with new onset doe 2014 referred by Timothy [facility] 01/26/2015 with document GOLD III COPD on 01/12/15    01/26/2015 1st Timothy Maldonado office visit/ Timothy Maldonado   Chief Complaint  Patient presents with  . Advice Only    Referred by Timothy. Renne Maldonado for SOB, DOE, weight loss  better since starting spiriva - only sob with exertion/ ok if paces himself slowly = [facility] 2   No obvious  day to day or daytime variabilty or assoc chronic cough or cp or chest tightness, subjective wheeze overt sinus or hb symptoms. No unusual exp hx or h/o childhood pna/ asthma or knowledge of premature birth.  Sleeping ok without nocturnal  or early am exacerbation  of respiratory  c/o's or need for noct saba. Also denies any obvious fluctuation of symptoms with weather or environmental changes or other aggravating or alleviating factors except as outlined above   Current Medications, Allergies, Complete Past Medical History, Past Surgical History, Family History, and Social History were reviewed in [employer] record.           Review of Systems  Constitutional: Negative for fever, chills, activity change, appetite change and unexpected weight change.  HENT: Negative for congestion, dental problem, postnasal drip, rhinorrhea, sneezing, sore throat, trouble swallowing and voice change.   Eyes: Negative for visual disturbance.  Respiratory: Positive for shortness of breath. Negative for cough and choking.   Cardiovascular: Negative for chest pain and leg swelling.  Gastrointestinal: Negative for nausea, vomiting and abdominal pain.  Genitourinary: Negative for difficulty urinating.  Musculoskeletal: Negative for arthralgias.  Skin: Negative for rash.  Psychiatric/Behavioral: Negative for behavioral problems and confusion.       Objective:   Physical Exam  Thin  hoarse amb wm nad   Wt Readings from Last 3 Encounters:  01/26/15 135 lb 3.2 oz (61.326 kg)  12/22/08 167 lb 9.6 oz (76.023 kg)  08/11/08 178 lb 6.4 oz (80.922 kg)    Vital signs reviewed    HEENT:  Edentulous/  nl  turbinates, and orophanx. Nl external ear canals without cough reflex   NECK :  without JVD/Nodes/TM/ nl carotid upstrokes bilaterally   LUNGS: no acc muscle use, barrel chest / distant bs s wheeze    CV:  RRR  no s3 or murmur or increase in P2, no edema   ABD:  soft and nontender with end inps hoover's sign.  No bruits or organomegaly, bowel sounds nl  MS:  warm without deformities, calf tenderness, cyanosis or clubbing  SKIN: warm and dry without lesions    NEURO:  alert, approp, no deficits   cxr at Timothy Maldonado office done 1st of August 2016 not available for review at time of ov           Assessment & Plan:

## 2015-01-27 ENCOUNTER — Encounter: Payer: Self-pay | Admitting: Internal Medicine

## 2015-01-27 DIAGNOSIS — J449 Chronic obstructive pulmonary disease, unspecified: Secondary | ICD-10-CM | POA: Insufficient documentation

## 2015-01-27 NOTE — Assessment & Plan Note (Signed)
>   3 min   I took an extended  opportunity with this patient to outline the consequences of continued cigarette use  in airway disorders based on all the data we have from the multiple national lung health studies (perfomed over decades at millions of dollars in cost)  indicating that smoking cessation, not choice of inhalers or physicians, is the most important aspect of care.    Used airplane analogy to review fletcher curve, could not get him to commit to quit at this point, mentions lots of stress and bordem at home as the reason he can't quit now/ Review E cigs as a one way bridge. Defer further f/u to primary care

## 2015-01-27 NOTE — Assessment & Plan Note (Signed)
Severe airflow obst mostly emphysematous features/ still smoking / spiriva best choice here but needs to quit smoking now (see sep a/p)  The proper method of use, as well as anticipated side effects, of a metered-dose inhaler are discussed and demonstrated to the patient. Improved effectiveness after extensive coaching during this visit to a level of approximately  90% so continue spriva in respimat form   Total time = 67m review case with pt/ discussion/ counseling/ giving and going over instructions (see avs)

## 2016-02-11 ENCOUNTER — Telehealth: Payer: Self-pay | Admitting: Acute Care

## 2016-02-11 DIAGNOSIS — F17210 Nicotine dependence, cigarettes, uncomplicated: Secondary | ICD-10-CM

## 2016-02-12 NOTE — Telephone Encounter (Signed)
Will forward to [facility] to follow up on appt for pt.

## 2016-02-24 NOTE — Telephone Encounter (Signed)
Called spoke with pt.  Scheduled SDMV 03/31/16 at 9am CT ordered Pt voiced understanding and had no further questions.

## 2016-03-31 ENCOUNTER — Ambulatory Visit (INDEPENDENT_AMBULATORY_CARE_PROVIDER_SITE_OTHER): Payer: Commercial Managed Care - HMO | Admitting: Acute Care

## 2016-03-31 ENCOUNTER — Encounter: Payer: Self-pay | Admitting: Acute Care

## 2016-03-31 ENCOUNTER — Ambulatory Visit (INDEPENDENT_AMBULATORY_CARE_PROVIDER_SITE_OTHER)
Admission: RE | Admit: 2016-03-31 | Discharge: 2016-03-31 | Disposition: A | Discharge status: Home / Self Care | Payer: Commercial Managed Care - HMO | Source: Ambulatory Visit | Attending: Acute Care | Admitting: Acute Care

## 2016-03-31 DIAGNOSIS — Z87891 Personal history of nicotine dependence: Secondary | ICD-10-CM | POA: Diagnosis not present

## 2016-03-31 DIAGNOSIS — I712 Thoracic aortic aneurysm, without rupture: Secondary | ICD-10-CM

## 2016-03-31 DIAGNOSIS — F17210 Nicotine dependence, cigarettes, uncomplicated: Secondary | ICD-10-CM | POA: Diagnosis not present

## 2016-03-31 HISTORY — DX: Thoracic aortic aneurysm, without rupture: I71.2

## 2016-03-31 NOTE — Progress Notes (Signed)
Shared Decision Making Visit Lung Cancer Screening Program [phone]([id number])   Eligibility:  Age 65 y.o.  Pack Years Smoking History Calculation 49 pack year smoking history (# packs/per year x # years smoked)  Recent History of coughing up blood  no  Unexplained weight loss? no ( >Than 15 pounds within the last 6 months )  Prior History Lung / other cancer no (Diagnosis within the last 5 years already requiring surveillance chest CT Scans).  Smoking Status Current Smoker  Former Smokers: Years since quit:NA  Quit Date: NA  Visit Components:  Discussion included one or more decision making aids. yes  Discussion included risk/benefits of screening. yes  Discussion included potential follow up diagnostic testing for abnormal scans. yes  Discussion included meaning and risk of over diagnosis. yes  Discussion included meaning and risk of False Positives. yes  Discussion included meaning of total radiation exposure. yes  Counseling Included:  Importance of adherence to annual lung cancer LDCT screening. yes  Impact of comorbidities on ability to participate in the program. yes  Ability and willingness to under diagnostic treatment. yes  Smoking Cessation Counseling:  Current Smokers:   Discussed importance of smoking cessation. yes  Information about tobacco cessation classes and interventions provided to patient. yes  Patient provided with "ticket" for LDCT Scan. yes  Symptomatic Patient. no  Counseling  Diagnosis Code: Tobacco Use Z72.0  Asymptomatic Patient yes  Counseling (Intermediate counseling: > three minutes counseling) [id number]  Former Smokers:   Discussed the importance of maintaining cigarette abstinence. yes  Diagnosis Code: Personal History of Nicotine Dependence. U98.119Z87.891  Information about tobacco cessation classes and interventions provided to patient. Yes  Patient provided with "ticket" for LDCT Scan. yes  Written Order for Lung Cancer  Screening with LDCT placed in Epic. Yes (CT Chest Lung Cancer Screening Low Dose W/O CM) [id number] Z12.2-Screening of respiratory organs Z87.891-Personal history of nicotine dependence  I have spent 20 minutes of face to face time with Timothy Maldonado discussing the risks and benefits of lung cancer screening. We viewed a power point together that explained in detail the above noted topics. We paused at intervals to allow for questions to be asked and answered to ensure understanding.We discussed that the single most powerful action that he can take to decrease his risk of developing lung cancer is to quit smoking. We discussed whether or not he is ready to commit to setting a quit date.He is currently not ready to set a quit date. We discussed options for tools to aid in quitting smoking including nicotine replacement therapy, non-nicotine medications, support groups, Quit Smart classes, and behavior modification. We discussed that often times setting smaller, more achievable goals, such as eliminating 1 cigarette a day for a week and then 2 cigarettes a day for a week can be helpful in slowly decreasing the number of cigarettes smoked. This allows for a sense of accomplishment as well as providing a clinical benefit. I gave him  the " Be Stronger Than Your Excuses" card with contact information for community resources, classes, free nicotine replacement therapy, and access to mobile apps, text messaging, and on-line smoking cessation help. I have also given him my card and contact information in the event he needs to contact me. We discussed the time and location of the scan, and that either Timothy Maldonado, CMA, or I will call with the results within 24-48 hours of receiving them. I have provided him  with a copy of the power point we viewed  as a resource in the event they need reinforcement of the concepts we discussed today in the office. The patient verbalized understanding of all of  the above and had no further  questions upon leaving the office. They have my contact information in the event they have any further questions.   Bevelyn Ngo, NP 03/31/2016

## 2016-04-01 ENCOUNTER — Telehealth: Payer: Self-pay | Admitting: Acute Care

## 2016-04-01 DIAGNOSIS — F17210 Nicotine dependence, cigarettes, uncomplicated: Secondary | ICD-10-CM

## 2016-04-01 NOTE — Telephone Encounter (Signed)
I have called Mr. Timothy Maldonado with the results of his low-dose screening CT. I explained to him that his scan was read as a lung RADS category 2, and dictating nodules that are benign in appearance or behavior. Recommendation per radiology is for continued annual screening with low-dose CT chest in 12 months. There was an incidental finding of a 4.1 cm diameter a sending thoracic aorta. I explained to Mr. Timothy Maldonado the recommendation was for imaging follow-up on an annual basis by either CTA or MRA. I told Mr. Timothy Maldonado that I would fax a copy of this to his primary care physician Dr. Merri BrunetteWalter Maldonado. Mr. Timothy Maldonado has an appointment with Dr. Renne Maldonado in 1 week, and they will be able to discussed this at that time. Mr. Timothy Maldonado is currently being treated with Crestor for coronary artery disease. He verbalized understanding of the above and had no further questions at completion of the phone call. He does have my contact information in the event that he has any further questions.

## 2016-04-20 ENCOUNTER — Institutional Professional Consult (permissible substitution) (INDEPENDENT_AMBULATORY_CARE_PROVIDER_SITE_OTHER): Payer: Commercial Managed Care - HMO | Admitting: Surgery

## 2016-04-20 ENCOUNTER — Encounter: Payer: Self-pay | Admitting: Surgery

## 2016-04-20 VITALS — BP 164/95 | HR 83 | Resp 18 | Ht 69.0 in | Wt 134.0 lb

## 2016-04-20 DIAGNOSIS — I712 Thoracic aortic aneurysm, without rupture, unspecified: Secondary | ICD-10-CM

## 2016-04-20 NOTE — Progress Notes (Signed)
PCP is Londell MohPHARR,[name] DAVIDSON, MD Referring Provider is [name], [name], MD  Chief Complaint  Patient presents with  . Thoracic Aortic Aneurysm    Surgical eval, CTA Chest 03/31/16    HPI:  The patient is a 65 year old long time heavy smoker with hypertension, hypercholesterolemia and COPD who had a low dose screening CT of the chest due to his smoking history and reported 50 lb weight loss over the past few years. This showed tiny scattered benign appearing lung nodules and a 4.1 cm fusiform ascending aortic aneurysm. He says that he feels fine overall and denies any chest or back pain. He does have some chronic exertional shortness of breath.  Past Medical History:  Diagnosis Date  . Bursitis   . Carotid atherosclerosis   . COPD (chronic obstructive pulmonary disease) (HCC)   . High cholesterol   . Hypertension   . Libido, decreased   . Low back pain   . Plantar wart     History reviewed. No pertinent surgical history.  Family History  Problem Relation Age of Onset  . Heart disease Father     Social History Social History  Substance Use Topics  . Smoking status: Current Every Day Smoker    Packs/day: 1.50    Years: 48.00    Types: Cigarettes  . Smokeless tobacco: Never Used  . Alcohol use 0.0 oz/week     Comment: occasionally  He owns a small towing company. Lives with his wife and two young adopted children.  Current Outpatient Prescriptions  Medication Sig Dispense Refill  . aspirin 81 MG tablet Take 81 mg by mouth daily.    . CRESTOR 20 MG tablet     . lisinopril (PRINIVIL,ZESTRIL) 40 MG tablet     . PROAIR RESPICLICK 108 (90 BASE) MCG/ACT AEPB     . Tiotropium Bromide Monohydrate (SPIRIVA RESPIMAT) 2.5 MCG/ACT AERS Inhale 1 puff into the lungs daily.      No current facility-administered medications for this visit.     Allergies  Allergen Reactions  . Sulfonamide Derivatives     Childhood allergy   Family History:  All of his family members  are deceased except a brother who has heart disease. No history of aneurysm disease.  Review of Systems  Constitutional: Positive for appetite change and unexpected weight change. Negative for activity change, chills, diaphoresis, fatigue and fever.  HENT:       Dentures  Eyes: Negative.   Respiratory: Positive for shortness of breath.        With exertion  Cardiovascular: Negative.   Gastrointestinal: Negative.   Endocrine: Negative.   Genitourinary: Negative.   Musculoskeletal: Negative.   Allergic/Immunologic: Negative.   Neurological: Negative.   Hematological: Negative.   Psychiatric/Behavioral: Negative.     BP (!) 164/95   Pulse 83   Resp 18   Ht 5\' 9"  (1.753 m)   Wt 134 lb (60.8 kg)   SpO2 99% Comment: RA  BMI 19.79 kg/m  Physical Exam  Constitutional: He is oriented to person, place, and time.  Thin gentleman in no distress  HENT:  Head: Normocephalic and atraumatic.  Mouth/Throat: Oropharynx is clear and moist.  Eyes: EOM are normal. Pupils are equal, round, and reactive to light.  Neck: Normal range of motion. Neck supple. No JVD present. No tracheal deviation present. No thyromegaly present.  Cardiovascular: Normal rate, regular rhythm and intact distal pulses.   No murmur heard. Pulmonary/Chest: Effort normal and breath sounds normal.  No respiratory distress. He has no wheezes. He has no rales. He exhibits no tenderness.  Increased AP diameter  Abdominal: Soft. Bowel sounds are normal. He exhibits no distension and no mass. There is no tenderness.  Musculoskeletal: Normal range of motion. He exhibits no edema.  Lymphadenopathy:    He has no cervical adenopathy.  Neurological: He is alert and oriented to person, place, and time. He has normal strength. No sensory deficit.  Skin: Skin is warm and dry.  Psychiatric: He has a normal mood and affect.     Diagnostic Tests:  CLINICAL DATA:  65 year old male with 49 pack year history of smoking. Lung cancer  screening.  EXAM: CT CHEST WITHOUT CONTRAST LOW-DOSE FOR LUNG CANCER SCREENING  TECHNIQUE: Multidetector CT imaging of the chest was performed following the standard protocol without IV contrast.  COMPARISON:  None.  FINDINGS: Cardiovascular: The heart size is normal. No pericardial effusion. Coronary artery calcification is noted. Ascending thoracic aorta measures 4.1 cm maximum diameter. Atherosclerotic calcification is noted in the wall of the thoracic aorta.  Mediastinum/Nodes: No mediastinal lymphadenopathy. No evidence for gross hilar lymphadenopathy although assessment is limited by the lack of intravenous contrast on today's study. The esophagus has normal imaging features. There is no axillary lymphadenopathy.  Lungs/Pleura: Changes of emphysema noted. Scattered bilateral pulmonary nodules are evident. Dominant nodule is in the lingula with volume derived mean diameter of 3.9 mm (image 194). No focal airspace consolidation. No pulmonary edema or pleural effusion.  Upper Abdomen: Unremarkable.  Musculoskeletal: Bone windows reveal no worrisome lytic or sclerotic osseous lesions.  IMPRESSION: Lung-RADS Category 2, benign appearance or behavior. Continue annual screening with low-dose chest CT without contrast in 12 months.  Emphysema.  **An incidental finding of potential clinical significance has been found. 4.1 cm diameter ascending thoracic aorta. Recommend annual imaging followup by CTA or MRA. This recommendation follows 2010 ACCF/AHA/AATS/ACR/ASA/SCA/SCAI/SIR/STS/SVM Guidelines for the Diagnosis and Management of Patients with Thoracic Aortic Disease. Circulation. 2010; 121: G956-O130: [id number]**   Electronically Signed   By: Kennith CenterEric  Mansell M.D.   On: 03/31/2016 13:39  Impression:  This 65 year old gentleman has an incidental 4.1 cm fusiform ascending aortic aneurysm on CT scan. This does not require surgical treatment at this time. I have  personally reviewed and interpreted his CT and the descending aorta measures 3.3 cm. Surgical treatment of an ascending aortic aneurysm is generally recommended at 5.5 cm. This should be followed yearly and since he is going to have a yearly low dose screening CT to assess for lung cancer the aneurysm can be followed at that time with the same study. This can be followed by Dr. Renne CriglerPharr and then I will be happy to see him back if the aneurysm enlarges or he develops a suspicious lung lesion. I reviewed the CT images with him and answered his questions. I strongly encouraged him to quit smoking and discussed the importance of good BP control with an aortic aneurysm.  Plan:  He will continue follow up with Dr. Renne CriglerPharr and I will be happy to see him back if the need arises.  Alleen BorneBryan K Bartle, MD [facility] [phone]

## 2017-01-31 DIAGNOSIS — E7800 Pure hypercholesterolemia, unspecified: Secondary | ICD-10-CM | POA: Diagnosis not present

## 2017-01-31 DIAGNOSIS — Z0000 Encounter for general adult medical examination without abnormal findings: Secondary | ICD-10-CM | POA: Diagnosis not present

## 2017-01-31 DIAGNOSIS — I10 Essential (primary) hypertension: Secondary | ICD-10-CM | POA: Diagnosis not present

## 2017-01-31 DIAGNOSIS — Z125 Encounter for screening for malignant neoplasm of prostate: Secondary | ICD-10-CM | POA: Diagnosis not present

## 2017-02-03 DIAGNOSIS — I712 Thoracic aortic aneurysm, without rupture: Secondary | ICD-10-CM | POA: Diagnosis not present

## 2017-02-03 DIAGNOSIS — I10 Essential (primary) hypertension: Secondary | ICD-10-CM | POA: Diagnosis not present

## 2017-02-03 DIAGNOSIS — E7800 Pure hypercholesterolemia, unspecified: Secondary | ICD-10-CM | POA: Diagnosis not present

## 2017-02-03 DIAGNOSIS — Z0001 Encounter for general adult medical examination with abnormal findings: Secondary | ICD-10-CM | POA: Diagnosis not present

## 2017-02-03 DIAGNOSIS — Z1212 Encounter for screening for malignant neoplasm of rectum: Secondary | ICD-10-CM | POA: Diagnosis not present

## 2017-03-03 DIAGNOSIS — Z23 Encounter for immunization: Secondary | ICD-10-CM | POA: Diagnosis not present

## 2017-03-03 DIAGNOSIS — I10 Essential (primary) hypertension: Secondary | ICD-10-CM | POA: Diagnosis not present

## 2017-03-03 DIAGNOSIS — Z1211 Encounter for screening for malignant neoplasm of colon: Secondary | ICD-10-CM | POA: Diagnosis not present

## 2017-03-03 DIAGNOSIS — R69 Illness, unspecified: Secondary | ICD-10-CM | POA: Diagnosis not present

## 2017-03-15 DIAGNOSIS — Z1211 Encounter for screening for malignant neoplasm of colon: Secondary | ICD-10-CM | POA: Diagnosis not present

## 2017-03-15 DIAGNOSIS — Z1212 Encounter for screening for malignant neoplasm of rectum: Secondary | ICD-10-CM | POA: Diagnosis not present

## 2017-04-04 ENCOUNTER — Ambulatory Visit (INDEPENDENT_AMBULATORY_CARE_PROVIDER_SITE_OTHER)
Admission: RE | Admit: 2017-04-04 | Discharge: 2017-04-04 | Disposition: A | Discharge status: Home / Self Care | Payer: Medicare HMO | Source: Ambulatory Visit | Attending: Acute Care | Admitting: Acute Care

## 2017-04-04 DIAGNOSIS — Z87891 Personal history of nicotine dependence: Secondary | ICD-10-CM

## 2017-04-04 DIAGNOSIS — F17210 Nicotine dependence, cigarettes, uncomplicated: Secondary | ICD-10-CM

## 2017-04-06 ENCOUNTER — Other Ambulatory Visit: Payer: Self-pay | Admitting: Acute Care

## 2017-04-06 DIAGNOSIS — F17210 Nicotine dependence, cigarettes, uncomplicated: Secondary | ICD-10-CM

## 2017-04-06 DIAGNOSIS — Z122 Encounter for screening for malignant neoplasm of respiratory organs: Secondary | ICD-10-CM

## 2017-04-11 DIAGNOSIS — I10 Essential (primary) hypertension: Secondary | ICD-10-CM | POA: Diagnosis not present

## 2017-06-27 DIAGNOSIS — I10 Essential (primary) hypertension: Secondary | ICD-10-CM | POA: Diagnosis not present

## 2017-10-17 DIAGNOSIS — R69 Illness, unspecified: Secondary | ICD-10-CM | POA: Diagnosis not present

## 2017-10-17 DIAGNOSIS — J449 Chronic obstructive pulmonary disease, unspecified: Secondary | ICD-10-CM | POA: Diagnosis not present

## 2017-10-17 DIAGNOSIS — R0602 Shortness of breath: Secondary | ICD-10-CM | POA: Diagnosis not present

## 2017-10-17 DIAGNOSIS — E7800 Pure hypercholesterolemia, unspecified: Secondary | ICD-10-CM | POA: Diagnosis not present

## 2017-10-17 DIAGNOSIS — J439 Emphysema, unspecified: Secondary | ICD-10-CM | POA: Diagnosis not present

## 2017-10-17 DIAGNOSIS — I10 Essential (primary) hypertension: Secondary | ICD-10-CM | POA: Diagnosis not present

## 2017-10-23 DIAGNOSIS — R0609 Other forms of dyspnea: Secondary | ICD-10-CM | POA: Diagnosis not present

## 2017-10-23 DIAGNOSIS — J432 Centrilobular emphysema: Secondary | ICD-10-CM | POA: Diagnosis not present

## 2017-10-23 DIAGNOSIS — R69 Illness, unspecified: Secondary | ICD-10-CM | POA: Diagnosis not present

## 2017-10-23 DIAGNOSIS — I10 Essential (primary) hypertension: Secondary | ICD-10-CM | POA: Diagnosis not present

## 2017-10-23 DIAGNOSIS — I712 Thoracic aortic aneurysm, without rupture: Secondary | ICD-10-CM | POA: Diagnosis not present

## 2017-11-03 DIAGNOSIS — R0602 Shortness of breath: Secondary | ICD-10-CM | POA: Diagnosis not present

## 2017-11-03 DIAGNOSIS — I10 Essential (primary) hypertension: Secondary | ICD-10-CM | POA: Diagnosis not present

## 2017-12-07 DIAGNOSIS — R0602 Shortness of breath: Secondary | ICD-10-CM | POA: Diagnosis not present

## 2017-12-07 DIAGNOSIS — I10 Essential (primary) hypertension: Secondary | ICD-10-CM | POA: Diagnosis not present

## 2018-01-04 DIAGNOSIS — Z136 Encounter for screening for cardiovascular disorders: Secondary | ICD-10-CM | POA: Diagnosis not present

## 2018-01-15 DIAGNOSIS — I712 Thoracic aortic aneurysm, without rupture: Secondary | ICD-10-CM | POA: Diagnosis not present

## 2018-01-15 DIAGNOSIS — R0609 Other forms of dyspnea: Secondary | ICD-10-CM | POA: Diagnosis not present

## 2018-01-15 DIAGNOSIS — J432 Centrilobular emphysema: Secondary | ICD-10-CM | POA: Diagnosis not present

## 2018-01-15 DIAGNOSIS — I10 Essential (primary) hypertension: Secondary | ICD-10-CM | POA: Diagnosis not present

## 2018-02-06 DIAGNOSIS — Z125 Encounter for screening for malignant neoplasm of prostate: Secondary | ICD-10-CM | POA: Diagnosis not present

## 2018-02-06 DIAGNOSIS — E7800 Pure hypercholesterolemia, unspecified: Secondary | ICD-10-CM | POA: Diagnosis not present

## 2018-02-06 DIAGNOSIS — Z7982 Long term (current) use of aspirin: Secondary | ICD-10-CM | POA: Diagnosis not present

## 2018-02-06 DIAGNOSIS — I10 Essential (primary) hypertension: Secondary | ICD-10-CM | POA: Diagnosis not present

## 2018-02-09 DIAGNOSIS — R0989 Other specified symptoms and signs involving the circulatory and respiratory systems: Secondary | ICD-10-CM | POA: Diagnosis not present

## 2018-02-09 DIAGNOSIS — Z0001 Encounter for general adult medical examination with abnormal findings: Secondary | ICD-10-CM | POA: Diagnosis not present

## 2018-02-09 DIAGNOSIS — I10 Essential (primary) hypertension: Secondary | ICD-10-CM | POA: Diagnosis not present

## 2018-02-09 DIAGNOSIS — N400 Enlarged prostate without lower urinary tract symptoms: Secondary | ICD-10-CM | POA: Diagnosis not present

## 2018-02-09 DIAGNOSIS — J439 Emphysema, unspecified: Secondary | ICD-10-CM | POA: Diagnosis not present

## 2018-02-09 DIAGNOSIS — I6529 Occlusion and stenosis of unspecified carotid artery: Secondary | ICD-10-CM | POA: Diagnosis not present

## 2018-02-09 DIAGNOSIS — E7800 Pure hypercholesterolemia, unspecified: Secondary | ICD-10-CM | POA: Diagnosis not present

## 2018-02-09 DIAGNOSIS — Z1212 Encounter for screening for malignant neoplasm of rectum: Secondary | ICD-10-CM | POA: Diagnosis not present

## 2018-02-09 DIAGNOSIS — I712 Thoracic aortic aneurysm, without rupture: Secondary | ICD-10-CM | POA: Diagnosis not present

## 2018-02-09 DIAGNOSIS — R69 Illness, unspecified: Secondary | ICD-10-CM | POA: Diagnosis not present

## 2018-03-26 DIAGNOSIS — J439 Emphysema, unspecified: Secondary | ICD-10-CM | POA: Diagnosis not present

## 2018-03-26 DIAGNOSIS — R69 Illness, unspecified: Secondary | ICD-10-CM | POA: Diagnosis not present

## 2018-04-16 ENCOUNTER — Ambulatory Visit: Payer: Medicare HMO

## 2018-04-17 ENCOUNTER — Ambulatory Visit (INDEPENDENT_AMBULATORY_CARE_PROVIDER_SITE_OTHER)
Admission: RE | Admit: 2018-04-17 | Discharge: 2018-04-17 | Disposition: A | Discharge status: Home / Self Care | Payer: Medicare HMO | Source: Ambulatory Visit | Attending: Acute Care | Admitting: Acute Care

## 2018-04-17 DIAGNOSIS — F17210 Nicotine dependence, cigarettes, uncomplicated: Secondary | ICD-10-CM | POA: Diagnosis not present

## 2018-04-17 DIAGNOSIS — R69 Illness, unspecified: Secondary | ICD-10-CM | POA: Diagnosis not present

## 2018-04-17 DIAGNOSIS — Z122 Encounter for screening for malignant neoplasm of respiratory organs: Secondary | ICD-10-CM

## 2018-04-23 ENCOUNTER — Other Ambulatory Visit: Payer: Self-pay | Admitting: Acute Care

## 2018-04-23 DIAGNOSIS — Z122 Encounter for screening for malignant neoplasm of respiratory organs: Secondary | ICD-10-CM

## 2018-04-23 DIAGNOSIS — Z87891 Personal history of nicotine dependence: Secondary | ICD-10-CM

## 2018-04-23 DIAGNOSIS — F17210 Nicotine dependence, cigarettes, uncomplicated: Secondary | ICD-10-CM

## 2018-07-21 ENCOUNTER — Encounter: Payer: Self-pay | Admitting: Cardiology

## 2018-07-21 DIAGNOSIS — Z136 Encounter for screening for cardiovascular disorders: Secondary | ICD-10-CM

## 2018-07-21 DIAGNOSIS — E7800 Pure hypercholesterolemia, unspecified: Secondary | ICD-10-CM

## 2018-07-21 HISTORY — DX: Pure hypercholesterolemia, unspecified: E78.00

## 2018-07-21 HISTORY — DX: Encounter for screening for cardiovascular disorders: Z13.6

## 2018-07-21 NOTE — Progress Notes (Addendum)
Subjective:  Primary Physician:  [name], [name], MD  @Patient  ID: Timothy CenterMichael Maldonado, male    DOB: 09/24/1950, 68 y.o.   MRN: [id number]  Chief Complaint  Patient presents with  . AAA    follow up 6 months    HPI: Timothy Maldonado  is a 68 y.o. male  with COPD, chronic shortness of breath and 4.1 cm Asc Aortic aneurysm and hypertension presents for a 6 month OV. He has noticed progression of dyspnea over the past few months.  He is an active smoker of cigarettes and has >70-80 pack year history, now smokes about a PPD. He has a history of ascending aortic aneurysm by CT scan done for lung cancer screening and noted to have ascending aortic dilatation of 4.1 cm in 2017 and stable in 2018, chronic cetrilobular emphysema, and hypertension. Patient has COPD with chronic dyspnea. He denies chest pain or tightness. Denies palpitations. Endorses occassional leg cramps at night.  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 I had seen him in May of 2019 for worsening dyspnea, decreased exercise tolerance and bradycardia. Underwent echo and stress and presents for f/u. Symptoms of dyspnea still persist, but better since last OV.  Past Medical History:  Diagnosis Date  . Bursitis   . Carotid atherosclerosis   . COPD (chronic obstructive pulmonary disease) (HCC)   . High cholesterol   . Hyperlipidemia, group A 07/21/2018  . Hypertension   . Libido, decreased   . Low back pain   . Plantar wart   . Screening for abdominal aortic aneurysm (AAA) performed 07/21/2018   Abdominal aortic duplex 01/04/2018: The maximum aorta diameter is 2.48 cm (prox).  Ectasia of the abdominal aorta measuring 2.48 x 2.4 x 2.47 cm is seen.  Focal plaque observed in the proximal, mid and distal aorta. Peak systolic velocities in the left internal iliac artery are mildly increased suggests <50% stenosis. Enlarged inferior vena cava with normal respiratory variation suggests elevated   .  Thoracic ascending aortic aneurysm (HCC) 03/31/2016   CT chest for lung cancer screening: 4.1 Asc Ao Aneurysm. 3 vessel coronary calcification    No past surgical history on file.  Social History   Socioeconomic History  . Marital status: Married    Spouse name: Not on file  . Number of children: 2  . Years of education: Not on file  . Highest education level: Not on file  Occupational History  . Not on file  Social Needs  . Financial resource strain: Not on file  . Food insecurity:    Worry: Not on file    Inability: Not on file  . Transportation needs:    Medical: Not on file    Non-medical: Not on file  Tobacco Use  . Smoking status: Current Every Day Smoker    Packs/day: 1.50    Years: 48.00    Pack years: 72.00    Types: Cigarettes  . Smokeless tobacco: Never Used  Substance and Sexual Activity  . Alcohol use: Yes    Alcohol/week: 0.0 standard drinks    Comment: occasionally  . Drug use: No  . Sexual activity: Not on file  Lifestyle  . Physical activity:    Days per week: Not on file    Minutes per session: Not on file  . Stress: Not on file  Relationships  . Social connections:    Talks on phone: Not on file    Gets together: Not on  file    Attends religious service: Not on file    Active member of club or organization: Not on file    Attends meetings of clubs or organizations: Not on file    Relationship status: Not on file  . Intimate partner violence:    Fear of current or ex partner: Not on file    Emotionally abused: Not on file    Physically abused: Not on file    Forced sexual activity: Not on file  Other Topics Concern  . Not on file  Social History Narrative   Works in towing    Current Outpatient Medications on File Prior to Visit  Medication Sig Dispense Refill  . ANORO ELLIPTA 62.5-25 MCG/INH AEPB Inhale 1 puff into the lungs daily.    Marland Kitchen aspirin 81 MG tablet Take 81 mg by mouth daily.    . CRESTOR 20 MG tablet     .  hydrochlorothiazide (MICROZIDE) 12.5 MG capsule Take 12.5 mg by mouth daily.    . irbesartan (AVAPRO) 150 MG tablet Take 150 mg by mouth daily after supper.     Marland Kitchen PROAIR RESPICLICK 108 (90 BASE) MCG/ACT AEPB      No current facility-administered medications on file prior to visit.     Review of Systems  Constitutional: Negative for malaise/fatigue and weight loss.  Respiratory: Positive for cough (chronic), shortness of breath (chronic and worsening lately) and wheezing. Negative for hemoptysis.   Cardiovascular: Negative for chest pain, palpitations, claudication and leg swelling.  Gastrointestinal: Negative for abdominal pain, blood in stool, constipation, heartburn and vomiting.  Genitourinary: Negative for dysuria.  Musculoskeletal: Positive for back pain (chronic). Negative for joint pain and myalgias.  Neurological: Negative for dizziness, focal weakness and headaches.  Endo/Heme/Allergies: Does not bruise/bleed easily.  Psychiatric/Behavioral: Negative for depression. The patient is not nervous/anxious.   All other systems reviewed and are negative.      Objective:  Blood pressure 140/75, pulse 78, height 5\' 10"  (1.778 m), weight 137 lb (62.1 kg), SpO2 95 %.  Physical Exam  Constitutional: He appears well-developed. No distress.  lean  HENT:  Head: Atraumatic.  Eyes: Conjunctivae are normal.  Neck: Neck supple. No JVD present. No thyromegaly present.  Cardiovascular: Normal rate, regular rhythm, normal heart sounds and intact distal pulses. Exam reveals no gallop.  No murmur heard. Pulmonary/Chest: Effort normal. He has wheezes. He has rales (bilateral diffuse extensive).  Barrel shaped chest  Abdominal: Soft. Bowel sounds are normal.  Musculoskeletal: Normal range of motion.        General: No edema.  Neurological: He is alert.  Skin: Skin is warm and dry.  Psychiatric: He has a normal mood and affect.     Laboratory Exam:  CBC Latest Ref Rng & Units 08/11/2008    WBC 4.5 - 10.5 10*3/microliter 11.1(H)  Hemoglobin 13.0 - 17.0 g/dL 79.8  Hematocrit 92.1 - 52.0 % 46.6  Platelets 150 - 400 K/uL 261   CMP Latest Ref Rng & Units 08/11/2008 10/25/2007 06/27/2007  Glucose 70 - 99 mg/dL 84 - 194(R)  BUN 6 - 23 mg/dL 15 16 21   Creatinine 0.4 - 1.5 mg/dL 0.8 0.8 1.0  Sodium 740 - 145 meq/L 139 - 138  Potassium 3.5 - 5.1 meq/L 4.4 4.4 4.3  Chloride 96 - 112 meq/L 101 - 100  CO2 19 - 32 meq/L 29 - 28  Calcium 8.4 - 10.5 mg/dL 9.5 - 9.6  Total Protein 6.0 - 8.3 g/dL 7.6 - -  Total Bilirubin 0.3 - 1.2 mg/dL 0.7 - -  Alkaline Phos 39 - 117 units/L 91 - -  AST 0 - 37 units/L 24 - 24  ALT 0 - 53 units/L 24 - 29   BMP Latest Ref Rng & Units 08/11/2008 10/25/2007 06/27/2007  Glucose 70 - 99 mg/dL 84 - 621(H107(H)  BUN 6 - 23 mg/dL 15 16 21   Creatinine 0.4 - 1.5 mg/dL 0.8 0.8 1.0  Sodium 086135 - 145 meq/L 139 - 138  Potassium 3.5 - 5.1 meq/L 4.4 4.4 4.3  Chloride 96 - 112 meq/L 101 - 100  CO2 19 - 32 meq/L 29 - 28  Calcium 8.4 - 10.5 mg/dL 9.5 - 9.6    Lipid Panel     Component Value Date/Time   CHOL 207 (HH) 08/11/2008 1122   TRIG 97 08/11/2008 1122   HDL 40.1 08/11/2008 1122   CHOLHDL 5.2 CALC 08/11/2008 1122   VLDL 19 08/11/2008 1122   LDLCALC 135 (H) 06/27/2007 0920   LDLDIRECT 150.3 08/11/2008 1122    CARDIAC STUDIES:  Echocardiogram 12/07/2017: Left ventricle cavity is normal in size. Normal global wall motion. Calculated EF 55%. Normal diastolic function. Mild (Grade I) aortic regurgitation. Mild to moderate mitral regurgitation. Mild prolapse of the mitral valve leaflets. IVC is dilated with a respiratory response of >50%. Estimated RA pressure 8 mmHg. No evidence of pulmonary hypertension.  Nuclear stress test  Lexiscan myoview stress test 11/03/2017: 1. The resting electrocardiogram demonstrated normal sinus rhythm, normal resting conduction, no resting arrhythmias and normal rest repolarization. Stress EKG is non-diagnostic for ischemia as it a  pharmacologic stress using Lexiscan. Patient developed frequent PVC in bigeminal pattern in recovery. Stress symptoms included dizziness. 2. Myocardial perfusion imaging is normal. Overall left ventricular systolic function was normal without regional wall motion abnormalities. The left ventricular ejection fraction was 66%. This is an intermediate risk study, clinical correlation recommended.  Abdominal aortic duplex 01/04/2018: The maximum aorta diameter is 2.48 cm (prox). Ectasia of the abdominal aorta measuring 2.48 x 2.4 x 2.47 cm is seen. Focal plaque observed in the proximal, mid and distal aorta. Peak systolic velocities in the left internal iliac artery are mildly increased suggests <50% stenosis. Enlarged inferior vena cava with normal respiratory variation suggests elevated central venous pressure.   Assessment & Recommendations:   1. Thoracic ascending aortic aneurysm (HCC) EKG 07/23/2018: Normal sinus rhythm at rate of 69 bpm, borderline criteria for left atrial enlargement, normal axis.  Right bundle branch block.  No evidence of ischemia, normal QT interval.  2. Hyperlipidemia, group A Being managed by Dr. Renne CriglerPharr and now well controlled on Crestor  3. COPD  GOLD III/ still smoking  Discussed smoking cessation  4. Cigarette smoker Still smoking 1ppd   Recommendation:  Patient with COPD, ongoing tobacco use disorder, small ascending aortic aneurysm presents here for his six-month office visit. Patient had repeat screening CT for lung cancer on 04/17/2018, severe three-vessel coronary calcification again noted, aortic atherosclerosis again noted.  Thoracic aorta was not mentioned.it may be worthwhile to repeat echocardiogram to evaluate his dyspnea and also to specifically concentrate on aortic root diameter.  His blood pressure was elevated this morning, patient states that his relatively well controlled at home except during morning when it can sometimes be higher than 140  mmHg.  Advised him to take Avapro at night and his blood pressure is greater than 130 mmHg, he will call us back in which case we could certainly increase Avapro from 150  mg to 300 mg daily.  Also option of adding amlodipine would also be good.  Otherwise he remains stable from cardiac standpoint.  There is no abdominal aortic aneurysm.  I'll see him back in 6 months.   Yates Decamp, MD, [facility] 07/23/2018, 1:58 PM [facility] [place] Pager: [phone] Office: [phone] If no answer Cell [phone]

## 2018-07-23 ENCOUNTER — Ambulatory Visit: Payer: Medicare HMO | Admitting: Cardiology

## 2018-07-23 ENCOUNTER — Encounter: Payer: Self-pay | Admitting: Cardiology

## 2018-07-23 VITALS — BP 140/75 | HR 78 | Ht 70.0 in | Wt 137.0 lb

## 2018-07-23 DIAGNOSIS — I712 Thoracic aortic aneurysm, without rupture: Secondary | ICD-10-CM

## 2018-07-23 DIAGNOSIS — I2510 Atherosclerotic heart disease of native coronary artery without angina pectoris: Secondary | ICD-10-CM

## 2018-07-23 DIAGNOSIS — Z136 Encounter for screening for cardiovascular disorders: Secondary | ICD-10-CM | POA: Diagnosis not present

## 2018-07-23 DIAGNOSIS — E7800 Pure hypercholesterolemia, unspecified: Secondary | ICD-10-CM | POA: Diagnosis not present

## 2018-07-23 DIAGNOSIS — R0600 Dyspnea, unspecified: Secondary | ICD-10-CM

## 2018-07-23 DIAGNOSIS — J449 Chronic obstructive pulmonary disease, unspecified: Secondary | ICD-10-CM | POA: Diagnosis not present

## 2018-07-23 DIAGNOSIS — R0609 Other forms of dyspnea: Secondary | ICD-10-CM

## 2018-07-23 DIAGNOSIS — F17210 Nicotine dependence, cigarettes, uncomplicated: Secondary | ICD-10-CM

## 2018-07-23 DIAGNOSIS — I7121 Aneurysm of the ascending aorta, without rupture: Secondary | ICD-10-CM

## 2018-07-23 DIAGNOSIS — R69 Illness, unspecified: Secondary | ICD-10-CM | POA: Diagnosis not present

## 2019-01-15 ENCOUNTER — Other Ambulatory Visit: Payer: Self-pay

## 2019-01-15 ENCOUNTER — Ambulatory Visit (INDEPENDENT_AMBULATORY_CARE_PROVIDER_SITE_OTHER): Payer: Medicare HMO

## 2019-01-15 DIAGNOSIS — I7121 Aneurysm of the ascending aorta, without rupture: Secondary | ICD-10-CM

## 2019-01-15 DIAGNOSIS — R0609 Other forms of dyspnea: Secondary | ICD-10-CM | POA: Diagnosis not present

## 2019-01-15 DIAGNOSIS — I10 Essential (primary) hypertension: Secondary | ICD-10-CM

## 2019-01-15 DIAGNOSIS — I712 Thoracic aortic aneurysm, without rupture: Secondary | ICD-10-CM

## 2019-01-15 DIAGNOSIS — R0600 Dyspnea, unspecified: Secondary | ICD-10-CM

## 2019-01-24 ENCOUNTER — Ambulatory Visit: Payer: Medicare HMO | Admitting: Cardiology

## 2019-01-31 ENCOUNTER — Ambulatory Visit: Payer: Medicare HMO | Admitting: Cardiology

## 2019-02-01 ENCOUNTER — Other Ambulatory Visit: Payer: Self-pay

## 2019-02-01 ENCOUNTER — Encounter: Payer: Self-pay | Admitting: Cardiology

## 2019-02-01 ENCOUNTER — Ambulatory Visit (INDEPENDENT_AMBULATORY_CARE_PROVIDER_SITE_OTHER): Payer: Medicare HMO | Admitting: Cardiology

## 2019-02-01 VITALS — BP 147/73 | HR 76 | Ht 70.0 in | Wt 134.0 lb

## 2019-02-01 DIAGNOSIS — R0609 Other forms of dyspnea: Secondary | ICD-10-CM

## 2019-02-01 DIAGNOSIS — I2510 Atherosclerotic heart disease of native coronary artery without angina pectoris: Secondary | ICD-10-CM

## 2019-02-01 DIAGNOSIS — I712 Thoracic aortic aneurysm, without rupture: Secondary | ICD-10-CM

## 2019-02-01 DIAGNOSIS — E7800 Pure hypercholesterolemia, unspecified: Secondary | ICD-10-CM | POA: Diagnosis not present

## 2019-02-01 DIAGNOSIS — R0600 Dyspnea, unspecified: Secondary | ICD-10-CM

## 2019-02-01 DIAGNOSIS — I7121 Aneurysm of the ascending aorta, without rupture: Secondary | ICD-10-CM

## 2019-02-01 NOTE — Progress Notes (Signed)
Primary Physician/Referring:  [name], [name], MD  Patient ID: Timothy Maldonado, male    DOB: 08/09/1950, 68 y.o.   MRN: [id number]  Chief Complaint  Patient presents with  . Hypertension  . Hyperlipidemia  . Follow-up   HPI:    Timothy Maldonado  is a 68 y.o. male  with COPD, chronic shortness of breath and 4.1 cm Asc Aortic aneurysm and hypertension presents for a 6 month OV. He has noticed progression of dyspnea over the past few months.  He is an active smoker of cigarettes and has >70-80 pack year history, now smokes about a PPD. He has a history of ascending aortic aneurysm by CT scan done for lung cancer screening and noted to have ascending aortic dilatation of 4.1 cm in 2017 and stable in 2018, chronic cetrilobular emphysema, and hypertension. Patient has COPD with chronic dyspnea.  Past Medical History:  Diagnosis Date  . Bursitis   . Carotid atherosclerosis   . COPD (chronic obstructive pulmonary disease) (HCC)   . High cholesterol   . Hyperlipidemia, group A 07/21/2018  . Hypertension   . Libido, decreased   . Low back pain   . Plantar wart   . Screening for abdominal aortic aneurysm (AAA) performed 07/21/2018   Abdominal aortic duplex 01/04/2018: The maximum aorta diameter is 2.48 cm (prox).  Ectasia of the abdominal aorta measuring 2.48 x 2.4 x 2.47 cm is seen.  Focal plaque observed in the proximal, mid and distal aorta. Peak systolic velocities in the left internal iliac artery are mildly increased suggests <50% stenosis. Enlarged inferior vena cava with normal respiratory variation suggests elevated   . Thoracic ascending aortic aneurysm (HCC) 03/31/2016   CT chest for lung cancer screening: 4.1 Asc Ao Aneurysm. 3 vessel coronary calcification   No past surgical history on file. Social History   Socioeconomic History  . Marital status: Married    Spouse name: Not on file  . Number of children: 2  . Years of education: Not on file  . Highest education level: Not on  file  Occupational History  . Not on file  Social Needs  . Financial resource strain: Not on file  . Food insecurity    Worry: Not on file    Inability: Not on file  . Transportation needs    Medical: Not on file    Non-medical: Not on file  Tobacco Use  . Smoking status: Current Every Day Smoker    Packs/day: 1.00    Years: 48.00    Pack years: 48.00    Types: Cigarettes  . Smokeless tobacco: Never Used  Substance and Sexual Activity  . Alcohol use: Yes    Alcohol/week: 0.0 standard drinks    Comment: occasionally  . Drug use: No  . Sexual activity: Not on file  Lifestyle  . Physical activity    Days per week: Not on file    Minutes per session: Not on file  . Stress: Not on file  Relationships  . Social Musicianconnections    Talks on phone: Not on file    Gets together: Not on file    Attends religious service: Not on file    Active member of club or organization: Not on file    Attends meetings of clubs or organizations: Not on file    Relationship status: Not on file  . Intimate partner violence    Fear of current or ex partner: Not on file    Emotionally abused: Not on file  Physically abused: Not on file    Forced sexual activity: Not on file  Other Topics Concern  . Not on file  Social History Narrative   Works in towing   ROS  Review of Systems  Constitution: Negative for chills, decreased appetite, malaise/fatigue and weight gain.  Cardiovascular: Positive for dyspnea on exertion. Negative for leg swelling and syncope.  Respiratory: Positive for cough.   Endocrine: Negative for cold intolerance.  Hematologic/Lymphatic: Does not bruise/bleed easily.  Musculoskeletal: Positive for back pain and joint pain. Negative for joint swelling.  Gastrointestinal: Negative for abdominal pain, anorexia, change in bowel habit, hematochezia and melena.  Neurological: Negative for headaches and light-headedness.  Psychiatric/Behavioral: Negative for depression and  substance abuse.  All other systems reviewed and are negative.  Objective  Blood pressure (!) 147/73, pulse 76, height 5\' 10"  (1.778 m), weight 134 lb (60.8 kg), SpO2 96 %. Body mass index is 19.23 kg/m.   Physical Exam  Constitutional: He appears well-developed. No distress.  lean  HENT:  Head: Atraumatic.  Eyes: Conjunctivae are normal.  Neck: Neck supple. No JVD present. No thyromegaly present.  Cardiovascular: Normal rate, regular rhythm, normal heart sounds and intact distal pulses. Exam reveals no gallop.  No murmur heard. Pulses:      Carotid pulses are 2+ on the right side and 2+ on the left side.      Femoral pulses are 1+ on the right side and 1+ on the left side.      Popliteal pulses are 1+ on the right side and 1+ on the left side.       Dorsalis pedis pulses are 0 on the right side and 0 on the left side.       Posterior tibial pulses are 1+ on the right side and 1+ on the left side.  No edema.  Pulmonary/Chest: Effort normal. He has wheezes. He has rales (bilateral diffuse extensive).  Barrel shaped chest  Abdominal: Soft. Bowel sounds are normal.  Musculoskeletal: Normal range of motion.        General: No edema.  Neurological: He is alert.  Skin: Skin is warm and dry.  Psychiatric: He has a normal mood and affect.   Radiology: No results found.  Laboratory examination:   No results for input(s): NA, K, CL, CO2, GLUCOSE, BUN, CREATININE, CALCIUM, GFRNONAA, GFRAA in the last 8760 hours. CMP Latest Ref Rng & Units 08/11/2008 10/25/2007 06/27/2007  Glucose 70 - 99 mg/dL 84 - 161(W107(H)  BUN 6 - 23 mg/dL 15 16 21   Creatinine 0.4 - 1.5 mg/dL 0.8 0.8 1.0  Sodium 960135 - 145 meq/L 139 - 138  Potassium 3.5 - 5.1 meq/L 4.4 4.4 4.3  Chloride 96 - 112 meq/L 101 - 100  CO2 19 - 32 meq/L 29 - 28  Calcium 8.4 - 10.5 mg/dL 9.5 - 9.6  Total Protein 6.0 - 8.3 g/dL 7.6 - -  Total Bilirubin 0.3 - 1.2 mg/dL 0.7 - -  Alkaline Phos 39 - 117 units/L 91 - -  AST 0 - 37 units/L 24 - 24   ALT 0 - 53 units/L 24 - 29   CBC Latest Ref Rng & Units 08/11/2008  WBC 4.5 - 10.5 10*3/microliter 11.1(H)  Hemoglobin 13.0 - 17.0 g/dL 45.416.2  Hematocrit 09.839.0 - 52.0 % 46.6  Platelets 150 - 400 K/uL 261   Lipid Panel     Component Value Date/Time   CHOL 207 (HH) 08/11/2008 1122   TRIG 97 08/11/2008 1122  HDL 40.1 08/11/2008 1122   CHOLHDL 5.2 CALC 08/11/2008 1122   VLDL 19 08/11/2008 1122   LDLCALC 135 (H) 06/27/2007 0920   LDLDIRECT 150.3 08/11/2008 1122   HEMOGLOBIN A1C No results found for: HGBA1C, MPG TSH No results for input(s): TSH in the last 8760 hours. Medications   Prior to Admission medications   Medication Sig Start Date End Date Taking? Authorizing Provider  ANORO ELLIPTA 62.5-25 MCG/INH AEPB Inhale 1 puff into the lungs daily. 07/04/18  Yes [provider]  aspirin 81 MG tablet Take 81 mg by mouth daily.   Yes [provider]  CRESTOR 20 MG tablet  01/05/15  Yes [provider]  hydrochlorothiazide (MICROZIDE) 12.5 MG capsule Take 12.5 mg by mouth daily.   Yes [provider]  irbesartan (AVAPRO) 150 MG tablet Take 150 mg by mouth daily after supper.    Yes [provider]  [place] 914 (90 BASE) MCG/ACT AEPB  01/12/15  Yes [provider]     Current Outpatient Medications  Medication Instructions  . ANORO ELLIPTA 62.5-25 MCG/INH AEPB 1 puff, Inhalation, Daily  . aspirin 81 mg, Oral, Daily  . CRESTOR 20 MG tablet No dose, route, or frequency recorded.  . hydrochlorothiazide (MICROZIDE) 12.5 mg, Oral, Daily  . irbesartan (AVAPRO) 150 mg, Oral, Daily after supper  . PROAIR RESPICLICK 782 (90 BASE) MCG/ACT AEPB No dose, route, or frequency recorded.    Cardiac Studies:   Chest CT for lung cancer screening 04/04/2017, compared to 03/31/2016: Cardiovascular: The heart size is normal. No pericardial effusion. Coronary artery calcification is noted. Ascending thoracic aorta measures 4.1 cm maximum  diameter. Atherosclerotic calcification is noted in the wall of the thoracic aorta.  No change from prior study   Nuclear stress test  Lexiscan myoview stress test 11/03/2017: 1. The resting electrocardiogram demonstrated normal sinus rhythm, normal resting conduction, no resting arrhythmias and normal rest repolarization. Stress EKG is non-diagnostic for ischemia as it a pharmacologic stress using Lexiscan. Patient developed frequent PVC in bigeminal pattern in recovery. Stress symptoms included dizziness. 2. Myocardial perfusion imaging is normal. Overall left ventricular systolic function was normal without regional wall motion abnormalities. The left ventricular ejection fraction was 66%. This is an intermediate risk study, clinical correlation recommended.  Abdominal aortic duplex 01/04/2018: The maximum aorta diameter is 2.48 cm (prox). Ectasia of the abdominal aorta measuring 2.48 x 2.4 x 2.47 cm is seen. Focal plaque observed in the proximal, mid and distal aorta. Peak systolic velocities in the left internal iliac artery are mildly increased suggests <50% stenosis. Enlarged inferior vena cava with normal respiratory variation suggests elevated central venous pressure.  Echo- 01/15/2019 1. Normal LV systolic function with EF 56%. Left ventricle cavity is normal in size. Normal global wall motion. Normal diastolic filling pattern. Calculated EF 56%. 2. Trace aortic regurgitation. Mild aortic valve leaflet thickening. 3. Moderate (Grade II) mitral regurgitation. 4. Mild tricuspid regurgitation. No evidence of pulmonary hypertension. 5. IVC is dilated with respiratory variation. 6. The aortic root is normal in size. Whole ascending aorta is not included in study. Pt's records show 4.1 cm aneurysm of ascending aorta by CT scan in past. Consider follow up CT if clinically indicated. 7. No diagnostic change c.f. echo. of 12/07/2017.  Assessment     ICD-10-CM   1. Thoracic ascending aortic  aneurysm (HCC)  I71.2 EKG 12-Lead  2. Coronary artery calcification seen on CAT scan  I25.10   3. Dyspnea on exertion  R06.09  4. Hyperlipidemia, group A  E78.00     EKG 02/01/2019: Normal sinus rhythm with rate of 74 bpm, normal axis, right bundle branch block.  No significant change from prior EKG.  Recommendations:   Patient presenting for mild ascending aortic aneurysm measuring 4.1 cm by CT scan of the chest done in 17 03/02/2017.  However 04/17/2018 CT did not mention this.  He is scheduled for repeat CT chest for lung cancer screening sometime in November 2020.  With regard to coronary calcification, continue medical therapy with statins and aspirin is indicated.  Smoking cessation again discussed with the patient.  Dyspnea is of noncardiac etiology related to underlying COPD.  I will see him back in 1 year, if there is no change in aortic size, I can see him back on a as needed basis.  His lipids are being managed by his PCP, otherwise he is on appropriate medical therapy.   Yates DecampJay Irbin Fines, MD, [facility] 02/01/2019, 10:35 AM [facility] [place] Pager: [phone] Office: [phone] If no answer Cell [phone]

## 2019-02-12 DIAGNOSIS — Z7982 Long term (current) use of aspirin: Secondary | ICD-10-CM | POA: Diagnosis not present

## 2019-02-12 DIAGNOSIS — Z125 Encounter for screening for malignant neoplasm of prostate: Secondary | ICD-10-CM | POA: Diagnosis not present

## 2019-02-12 DIAGNOSIS — I10 Essential (primary) hypertension: Secondary | ICD-10-CM | POA: Diagnosis not present

## 2019-02-12 DIAGNOSIS — Z1159 Encounter for screening for other viral diseases: Secondary | ICD-10-CM | POA: Diagnosis not present

## 2019-02-12 DIAGNOSIS — E7800 Pure hypercholesterolemia, unspecified: Secondary | ICD-10-CM | POA: Diagnosis not present

## 2019-02-20 DIAGNOSIS — E7800 Pure hypercholesterolemia, unspecified: Secondary | ICD-10-CM | POA: Diagnosis not present

## 2019-02-20 DIAGNOSIS — E875 Hyperkalemia: Secondary | ICD-10-CM | POA: Diagnosis not present

## 2019-02-20 DIAGNOSIS — N400 Enlarged prostate without lower urinary tract symptoms: Secondary | ICD-10-CM | POA: Diagnosis not present

## 2019-02-20 DIAGNOSIS — I10 Essential (primary) hypertension: Secondary | ICD-10-CM | POA: Diagnosis not present

## 2019-02-20 DIAGNOSIS — R69 Illness, unspecified: Secondary | ICD-10-CM | POA: Diagnosis not present

## 2019-02-20 DIAGNOSIS — Z0001 Encounter for general adult medical examination with abnormal findings: Secondary | ICD-10-CM | POA: Diagnosis not present

## 2019-02-20 DIAGNOSIS — I712 Thoracic aortic aneurysm, without rupture: Secondary | ICD-10-CM | POA: Diagnosis not present

## 2019-02-20 DIAGNOSIS — R636 Underweight: Secondary | ICD-10-CM | POA: Diagnosis not present

## 2019-02-20 DIAGNOSIS — J439 Emphysema, unspecified: Secondary | ICD-10-CM | POA: Diagnosis not present

## 2019-02-20 DIAGNOSIS — I6529 Occlusion and stenosis of unspecified carotid artery: Secondary | ICD-10-CM | POA: Diagnosis not present

## 2019-02-20 DIAGNOSIS — Z23 Encounter for immunization: Secondary | ICD-10-CM | POA: Diagnosis not present

## 2019-02-20 DIAGNOSIS — K4090 Unilateral inguinal hernia, without obstruction or gangrene, not specified as recurrent: Secondary | ICD-10-CM | POA: Diagnosis not present

## 2019-05-07 ENCOUNTER — Other Ambulatory Visit: Payer: Self-pay

## 2019-05-07 ENCOUNTER — Ambulatory Visit (INDEPENDENT_AMBULATORY_CARE_PROVIDER_SITE_OTHER)
Admission: RE | Admit: 2019-05-07 | Discharge: 2019-05-07 | Disposition: A | Discharge status: Home / Self Care | Payer: Medicare HMO | Source: Ambulatory Visit | Attending: Acute Care | Admitting: Acute Care

## 2019-05-07 DIAGNOSIS — R69 Illness, unspecified: Secondary | ICD-10-CM | POA: Diagnosis not present

## 2019-05-07 DIAGNOSIS — Z87891 Personal history of nicotine dependence: Secondary | ICD-10-CM

## 2019-05-07 DIAGNOSIS — F17210 Nicotine dependence, cigarettes, uncomplicated: Secondary | ICD-10-CM

## 2019-05-07 DIAGNOSIS — Z122 Encounter for screening for malignant neoplasm of respiratory organs: Secondary | ICD-10-CM

## 2019-05-14 ENCOUNTER — Inpatient Hospital Stay: Admission: RE | Admit: 2019-05-14 | Payer: Medicare HMO | Source: Ambulatory Visit

## 2019-05-17 ENCOUNTER — Other Ambulatory Visit: Payer: Self-pay | Admitting: *Deleted

## 2019-05-17 DIAGNOSIS — Z87891 Personal history of nicotine dependence: Secondary | ICD-10-CM

## 2019-05-17 DIAGNOSIS — Z122 Encounter for screening for malignant neoplasm of respiratory organs: Secondary | ICD-10-CM

## 2019-05-17 DIAGNOSIS — F17210 Nicotine dependence, cigarettes, uncomplicated: Secondary | ICD-10-CM

## 2019-07-28 ENCOUNTER — Ambulatory Visit: Payer: Medicare HMO | Attending: Internal Medicine

## 2019-07-28 DIAGNOSIS — Z23 Encounter for immunization: Secondary | ICD-10-CM

## 2019-07-28 NOTE — Progress Notes (Signed)
   Covid-19 Vaccination Clinic  Name:  Timothy Maldonado    MRN: [id number] DOB: [birth year]  07/28/2019  [name] was observed post Covid-19 immunization for 15 minutes without incidence. He was provided with Vaccine Information Sheet and instruction to access the V-Safe system.   [name] was instructed to call 911 with any severe reactions post vaccine: Marland Kitchen Difficulty breathing  . Swelling of your face and throat  . A fast heartbeat  . A bad rash all over your body  . Dizziness and weakness    Immunizations Administered    Name Date Dose VIS Date Route   Pfizer COVID-19 Vaccine 07/28/2019  2:48 PM 0.3 mL 05/24/2019 Intramuscular   Manufacturer: [employer], [employer]   Lot: [id number]   NDC: [address]-7

## 2019-08-20 ENCOUNTER — Ambulatory Visit: Payer: Self-pay | Attending: Internal Medicine

## 2019-08-20 DIAGNOSIS — Z23 Encounter for immunization: Secondary | ICD-10-CM | POA: Insufficient documentation

## 2019-08-20 NOTE — Progress Notes (Signed)
   Covid-19 Vaccination Clinic  Name:  Timothy Maldonado    MRN: [id number] DOB: December 06, 1950  08/20/2019  [name] was observed post Covid-19 immunization for 15 minutes without incident. He was provided with Vaccine Information Sheet and instruction to access the V-Safe system.   [name] was instructed to call 911 with any severe reactions post vaccine: Marland Kitchen Difficulty breathing  . Swelling of face and throat  . A fast heartbeat  . A bad rash all over body  . Dizziness and weakness   Immunizations Administered    Name Date Dose VIS Date Route   Pfizer COVID-19 Vaccine 08/20/2019  9:18 AM 0.3 mL 05/24/2019 Intramuscular   Manufacturer: [employer], [employer]   Lot: [id number]   NDC: [address]-8

## 2019-08-21 ENCOUNTER — Ambulatory Visit: Payer: Medicare HMO

## 2020-01-31 ENCOUNTER — Ambulatory Visit: Payer: Medicare HMO | Admitting: Cardiology

## 2020-01-31 ENCOUNTER — Other Ambulatory Visit: Payer: Self-pay

## 2020-01-31 ENCOUNTER — Encounter: Payer: Self-pay | Admitting: Cardiology

## 2020-01-31 VITALS — BP 125/82 | HR 77 | Resp 16 | Ht 70.0 in | Wt 141.0 lb

## 2020-01-31 DIAGNOSIS — J449 Chronic obstructive pulmonary disease, unspecified: Secondary | ICD-10-CM

## 2020-01-31 DIAGNOSIS — I340 Nonrheumatic mitral (valve) insufficiency: Secondary | ICD-10-CM | POA: Diagnosis not present

## 2020-01-31 DIAGNOSIS — I2510 Atherosclerotic heart disease of native coronary artery without angina pectoris: Secondary | ICD-10-CM

## 2020-01-31 DIAGNOSIS — I712 Thoracic aortic aneurysm, without rupture: Secondary | ICD-10-CM

## 2020-01-31 DIAGNOSIS — E7800 Pure hypercholesterolemia, unspecified: Secondary | ICD-10-CM | POA: Diagnosis not present

## 2020-01-31 DIAGNOSIS — R0609 Other forms of dyspnea: Secondary | ICD-10-CM | POA: Diagnosis not present

## 2020-01-31 DIAGNOSIS — R0600 Dyspnea, unspecified: Secondary | ICD-10-CM

## 2020-01-31 DIAGNOSIS — I7121 Aneurysm of the ascending aorta, without rupture: Secondary | ICD-10-CM

## 2020-01-31 NOTE — Progress Notes (Deleted)
Primary Physician/Referring:  Deland Pretty, MD  Patient ID: Timothy Maldonado, male    DOB: 04-08-1951, 69 y.o.   MRN: [id number]  Chief Complaint  Patient presents with   Thoracic Aortic Aneurysm   Follow-up    1 year   Shortness of Breath   HPI:    Jeral Zick  is a 69 y.o. male  with COPD with centrilobular emphysema, chronic shortness of breath and ?4.1 cm Asc Aortic aneurysm and hypertension presents for a 1 year follow up.  He has a history of ascending aortic aneurysm by CT scan done for lung cancer screening and noted to have ascending aortic dilatation of 4.1 cm in 2017 with subsequent scans not mentioning AAA, also echocardiogram in 2019 and also in 2020 also did not reveal ascending aortic aneurysm.  Patient reports chronic worsening dyspnea on exertion over the last year. He checks his O2 sat and heart rate at home when he exerts himself and notices his O2 dropping to about 87% and his heart rate at about 104 bpm. Patient is actively smoking PPD and has >70-80 pack year history. He also monitors his blood pressure at home, is it well controlled at about 120/85. No recent lab work available for review as he has not had a PCP appointment in the last year. Reports he will see PCP next month.   Past Medical History:  Diagnosis Date   Bursitis    Carotid atherosclerosis    COPD (chronic obstructive pulmonary disease) (HCC)    High cholesterol    Hyperlipidemia, group A 07/21/2018   Hypertension    Libido, decreased    Low back pain    Plantar wart    Screening for abdominal aortic aneurysm (AAA) performed 07/21/2018   Abdominal aortic duplex 01/04/2018: The maximum aorta diameter is 2.48 cm (prox).  Ectasia of the abdominal aorta measuring 2.48 x 2.4 x 2.47 cm is seen.  Focal plaque observed in the proximal, mid and distal aorta. Peak systolic velocities in the left internal iliac artery are mildly increased suggests <50% stenosis. Enlarged inferior vena cava with  normal respiratory variation suggests elevated    Thoracic ascending aortic aneurysm ([place]) 03/31/2016   CT chest for lung cancer screening: 4.1 Asc Ao Aneurysm. 3 vessel coronary calcification   History reviewed. No pertinent surgical history.   Social History   Tobacco Use   Smoking status: Current Every Day Smoker    Packs/day: 1.50    Years: 48.00    Pack years: 72.00    Types: Cigarettes   Smokeless tobacco: Never Used  Substance Use Topics   Alcohol use: Yes    Alcohol/week: 0.0 standard drinks    Comment: occasionally   Marital Status: Married   ROS  Review of Systems  Cardiovascular: Positive for dyspnea on exertion (chronic). Negative for chest pain, leg swelling, palpitations and syncope.  Respiratory: Positive for cough, sputum production and wheezing.   Gastrointestinal: Negative for melena.   Objective  Blood pressure 125/82, pulse 77, resp. rate 16, height 5' 10"  (1.778 m), weight 141 lb (64 kg), SpO2 96 %. Body mass index is 20.23 kg/m.  Vitals with BMI 01/31/2020 02/01/2019 07/23/2018  Height 5' 10"  5' 10"  5' 10"   Weight 141 lbs 134 lbs 137 lbs  BMI 20.23 46.96 29.52  Systolic 841 324 401  Diastolic 82 73 75  Pulse 77 76 78      Physical Exam Constitutional:      General: He is not in acute distress.  Comments: lean  Eyes:     Conjunctiva/sclera: Conjunctivae normal.  Neck:     Thyroid: No thyromegaly.     Vascular: No JVD.  Cardiovascular:     Rate and Rhythm: Normal rate and regular rhythm.     Pulses: Intact distal pulses.          Carotid pulses are 2+ on the right side and 2+ on the left side.      Radial pulses are 2+ on the right side and 2+ on the left side.       Femoral pulses are 1+ on the right side and 1+ on the left side.      Popliteal pulses are 1+ on the right side and 1+ on the left side.       Dorsalis pedis pulses are 0 on the right side and 0 on the left side.       Posterior tibial pulses are 1+ on the right side and 1+  on the left side.     Heart sounds: Normal heart sounds. No murmur heard.  No gallop.      Comments: No edema. Pulmonary:     Effort: Pulmonary effort is normal. Tachypnea present. No respiratory distress.     Breath sounds: Rales (bilateral diffuse extensive) present.     Comments: Barrel shapedd chest. Prolonged expiration Abdominal:     General: Bowel sounds are normal.     Palpations: Abdomen is soft.  Musculoskeletal:        General: Normal range of motion.  Skin:    General: Skin is warm and dry.    Radiology: No results found.  Laboratory examination:   No results for input(s): NA, K, CL, CO2, GLUCOSE, BUN, CREATININE, CALCIUM, GFRNONAA, GFRAA in the last 8760 hours. CMP Latest Ref Rng & Units 08/11/2008 10/25/2007 06/27/2007  Glucose 70 - 99 mg/dL 84 - 107(H)  BUN 6 - 23 mg/dL 15 16 21   Creatinine 0.4 - 1.5 mg/dL 0.8 0.8 1.0  Sodium 135 - 145 meq/L 139 - 138  Potassium 3.5 - 5.1 meq/L 4.4 4.4 4.3  Chloride 96 - 112 meq/L 101 - 100  CO2 19 - 32 meq/L 29 - 28  Calcium 8.4 - 10.5 mg/dL 9.5 - 9.6  Total Protein 6.0 - 8.3 g/dL 7.6 - -  Total Bilirubin 0.3 - 1.2 mg/dL 0.7 - -  Alkaline Phos 39 - 117 units/L 91 - -  AST 0 - 37 units/L 24 - 24  ALT 0 - 53 units/L 24 - 29   CBC Latest Ref Rng & Units 08/11/2008  WBC 4.5 - 10.5 10*3/microliter 11.1(H)  Hemoglobin 13.0 - 17.0 g/dL 16.2  Hematocrit 39.0 - 52.0 % 46.6  Platelets 150 - 400 K/uL 261   Lipid Panel     Component Value Date/Time   CHOL 207 (HH) 08/11/2008 1122   TRIG 97 08/11/2008 1122   HDL 40.1 08/11/2008 1122   CHOLHDL 5.2 CALC 08/11/2008 1122   VLDL 19 08/11/2008 1122   LDLCALC 135 (H) 06/27/2007 0920   LDLDIRECT 150.3 08/11/2008 1122   HEMOGLOBIN A1C No results found for: HGBA1C, MPG TSH No results for input(s): TSH in the last 8760 hours.   External Labs:  Cholesterol, total 167.000 M 01/14/2016 HDL 53.000 MG 01/14/2016 LDL-C 95.000 02/12/2019 Triglycerides 64.000 02/12/2019  02/20/2019: eGFR 73.970    Medications    Current Outpatient Medications  Medication Instructions   ANORO ELLIPTA 62.5-25 MCG/INH AEPB 1 puff, Inhalation, Daily   aspirin  81 mg, Oral, Daily   CRESTOR 20 MG tablet No dose, route, or frequency recorded.   ezetimibe (ZETIA) 10 mg, Oral, Daily   hydrochlorothiazide (MICROZIDE) 12.5 mg, Oral, Daily   irbesartan (AVAPRO) 150 mg, Oral, Daily after supper   PROAIR RESPICLICK 388 (90 BASE) MCG/ACT AEPB No dose, route, or frequency recorded.    Cardiac Studies:   Chest CT for lung cancer screening 04/04/2017, compared to 03/31/2016: Cardiovascular: The heart size is normal. No pericardial effusion. Coronary artery calcification is noted. Ascending thoracic aorta measures 4.1 cm maximum diameter. Atherosclerotic calcification is noted in the wall of the thoracic aorta.  No change from prior study   Nuclear stress test  Lexiscan myoview stress test 11/03/2017: 1. The resting electrocardiogram demonstrated normal sinus rhythm, normal resting conduction, no resting arrhythmias and normal rest repolarization. Stress EKG is non-diagnostic for ischemia as it a pharmacologic stress using Lexiscan. Patient developed frequent PVC in bigeminal pattern in recovery. Stress symptoms included dizziness. 2. Myocardial perfusion imaging is normal. Overall left ventricular systolic function was normal without regional wall motion abnormalities. The left ventricular ejection fraction was 66%. This is an intermediate risk study, clinical correlation recommended.  Abdominal aortic duplex 01/04/2018: The maximum aorta diameter is 2.48 cm (prox). Ectasia of the abdominal aorta measuring 2.48 x 2.4 x 2.47 cm is seen. Focal plaque observed in the proximal, mid and distal aorta. Peak systolic velocities in the left internal iliac artery are mildly increased suggests <50% stenosis. Enlarged inferior vena cava with normal respiratory variation suggests elevated central venous  pressure.  Echo- 01/15/2019 1. Normal LV systolic function with EF 56%. Left ventricle cavity is normal in size. Normal global wall motion. Normal diastolic filling pattern. Calculated EF 56%. 2. Trace aortic regurgitation. Mild aortic valve leaflet thickening. 3. Moderate (Grade II) mitral regurgitation. 4. Mild tricuspid regurgitation. No evidence of pulmonary hypertension. 5. IVC is dilated with respiratory variation. 6. The aortic root is normal in size. Whole ascending aorta is not included in study. Pt's records show 4.1 cm aneurysm of ascending aorta by CT scan in past. Consider follow up CT if clinically indicated. 7. No diagnostic change c.f. echo. of 12/07/2017.  Chest CT for lung cancer screening 05/07/2019:  Cardiovascular: Aortic atherosclerosis. Normal heart size, without pericardial effusion. Multivessel coronary artery atherosclerosis.  EKG: EKG 01/31/2020: Normal sinus rhythm with rate of 80 bpm, left atrial enlargement, normal axis.  Right bundle branch block.  No significant change from prior EKG.   Assessment     ICD-10-CM   1. Dyspnea on exertion  R06.00 EKG 12-Lead  2. Coronary artery calcification seen on CAT scan  I25.10   3. Thoracic ascending aortic aneurysm (HCC)  I71.2   4. COPD  GOLD III/ still smoking   J44.9   5. Hyperlipidemia, group A  E78.00   6. Moderate mitral regurgitation  I34.0 PCV ECHOCARDIOGRAM COMPLETE    Recommendations:   Ansar Skoda  is a 69 y.o. male  with COPD with centrilobular emphysema, chronic shortness of breath and ?4.1 cm Asc Aortic aneurysm and hypertension presents for a 1 year follow up.  He has a history of ascending aortic aneurysm by CT scan done for lung cancer screening and noted to have ascending aortic dilatation of 4.1 cm in 2017 with subsequent scans not mentioning AAA, also echocardiogram in 2019 and also in 2020 also did not reveal ascending aortic aneurysm.  Patient presenting for mild ascending aortic aneurysm  measuring 4.1 cm by CT scan of the  chest done in 03/02/2017.  However 04/17/2018 CT and 05/07/2019 CT did not mention this.  He is scheduled for repeat CT chest for lung cancer screening later this year. Will continue to follow annual CT chest results. However, suspect aortic aneurysm noted in 2018 to be an error as it has not been noted since.   Coronary calcification also noted on CT, recommend risk factor control. Patient currently taking Crestor 79m and Zetia 562m No recent lipid panel to assess control. Patient prefers to follow with PCP to manage lipids. May consider increasing Zetia to 10 mg if lipids elevated on next panel. Also continue aspirin. Hypertension is managed by PCP and well controlled on irbesartan and HCTZ.   Suspect dyspnea is due to underlying COPD. Encouraged patient to follow up with PCP in regards to management, may consider pulmonology referral.   Will obtain echo to d.u ob mitral regurgitation as an etiology of dyspnea as last echo was 01/15/2019. Will call with results of echo. Follow up in 1 year, sooner if needed.    JaAdrian ProwsMD, FAJefferson Hospital/20/2021, 1:47 PM Office: [phone]

## 2020-01-31 NOTE — Progress Notes (Signed)
Primary Physician/Referring:  Deland Pretty, MD  Patient ID: Timothy Maldonado, male    DOB: [birth year], 69 y.o.   MRN: [id number]  Chief Complaint  Patient presents with  . Thoracic Aortic Aneurysm  . Follow-up    1 year  . Shortness of Breath   HPI:    Timothy Maldonado  is a 69 y.o. male  with COPD with centrilobular emphysema, chronic shortness of breath and ?4.1 cm Asc Aortic aneurysm and hypertension presents for a 1 year follow up.  He has a history of ascending aortic aneurysm by CT scan done for lung cancer screening and noted to have ascending aortic dilatation of 4.1 cm in 2017 with subsequent scans not mentioning AAA, also echocardiogram in 2019 and also in 2020 also did not reveal ascending aortic aneurysm.  Patient reports chronic worsening dyspnea on exertion over the last year. He checks his O2 sat and heart rate at home when he exerts himself and notices his O2 dropping to about 87% and his heart rate at about 104 bpm. Patient is actively smoking PPD and has >70-80 pack year history. He also monitors his blood pressure at home, is it well controlled at about 120/85. No recent lab work available for review as he has not had a PCP appointment in the last year. Reports he will see PCP next month.   Past Medical History:  Diagnosis Date  . Bursitis   . Carotid atherosclerosis   . COPD (chronic obstructive pulmonary disease) ([place])   . High cholesterol   . Hyperlipidemia, group A 07/21/2018  . Hypertension   . Libido, decreased   . Low back pain   . Plantar wart   . Screening for abdominal aortic aneurysm (AAA) performed 07/21/2018   Abdominal aortic duplex 01/04/2018: The maximum aorta diameter is 2.48 cm (prox).  Ectasia of the abdominal aorta measuring 2.48 x 2.4 x 2.47 cm is seen.  Focal plaque observed in the proximal, mid and distal aorta. Peak systolic velocities in the left internal iliac artery are mildly increased suggests <50% stenosis. Enlarged inferior vena cava with  normal respiratory variation suggests elevated   . Thoracic ascending aortic aneurysm ([place]) 03/31/2016   CT chest for lung cancer screening: 4.1 Asc Ao Aneurysm. 3 vessel coronary calcification   History reviewed. No pertinent surgical history.   Social History   Tobacco Use  . Smoking status: Current Every Day Smoker    Packs/day: 1.50    Years: 48.00    Pack years: 72.00    Types: Cigarettes  . Smokeless tobacco: Never Used  Substance Use Topics  . Alcohol use: Yes    Alcohol/week: 0.0 standard drinks    Comment: occasionally   Marital Status: Married   ROS  Review of Systems  Cardiovascular: Positive for dyspnea on exertion (chronic). Negative for chest pain, leg swelling, palpitations and syncope.  Respiratory: Positive for cough, sputum production and wheezing.   Gastrointestinal: Negative for melena.   Objective  Blood pressure 125/82, pulse 77, resp. rate 16, height 5' 10"  (1.778 m), weight 141 lb (64 kg), SpO2 96 %. Body mass index is 20.23 kg/m.  Vitals with BMI 01/31/2020 02/01/2019 07/23/2018  Height 5' 10"  5' 10"  5' 10"   Weight 141 lbs 134 lbs 137 lbs  BMI 20.23 80.22 33.61  Systolic 224 497 530  Diastolic 82 73 75  Pulse 77 76 78      Physical Exam Constitutional:      General: He is not in acute distress.  Comments: lean  Eyes:     Conjunctiva/sclera: Conjunctivae normal.  Neck:     Thyroid: No thyromegaly.     Vascular: No JVD.  Cardiovascular:     Rate and Rhythm: Normal rate and regular rhythm.     Pulses: Intact distal pulses.          Carotid pulses are 2+ on the right side and 2+ on the left side.      Radial pulses are 2+ on the right side and 2+ on the left side.       Femoral pulses are 1+ on the right side and 1+ on the left side.      Popliteal pulses are 1+ on the right side and 1+ on the left side.       Dorsalis pedis pulses are 0 on the right side and 0 on the left side.       Posterior tibial pulses are 1+ on the right side and 1+  on the left side.     Heart sounds: Normal heart sounds. No murmur heard.  No gallop.      Comments: No edema. Pulmonary:     Effort: Pulmonary effort is normal. Tachypnea present. No respiratory distress.     Breath sounds: Rales (bilateral diffuse extensive) present.     Comments: Barrel shapedd chest. Prolonged expiration Abdominal:     General: Bowel sounds are normal.     Palpations: Abdomen is soft.  Musculoskeletal:        General: Normal range of motion.  Skin:    General: Skin is warm and dry.    Radiology: No results found.  Laboratory examination:   No results for input(s): NA, K, CL, CO2, GLUCOSE, BUN, CREATININE, CALCIUM, GFRNONAA, GFRAA in the last 8760 hours. CMP Latest Ref Rng & Units 08/11/2008 10/25/2007 06/27/2007  Glucose 70 - 99 mg/dL 84 - 107(H)  BUN 6 - 23 mg/dL 15 16 21   Creatinine 0.4 - 1.5 mg/dL 0.8 0.8 1.0  Sodium 135 - 145 meq/L 139 - 138  Potassium 3.5 - 5.1 meq/L 4.4 4.4 4.3  Chloride 96 - 112 meq/L 101 - 100  CO2 19 - 32 meq/L 29 - 28  Calcium 8.4 - 10.5 mg/dL 9.5 - 9.6  Total Protein 6.0 - 8.3 g/dL 7.6 - -  Total Bilirubin 0.3 - 1.2 mg/dL 0.7 - -  Alkaline Phos 39 - 117 units/L 91 - -  AST 0 - 37 units/L 24 - 24  ALT 0 - 53 units/L 24 - 29   CBC Latest Ref Rng & Units 08/11/2008  WBC 4.5 - 10.5 10*3/microliter 11.1(H)  Hemoglobin 13.0 - 17.0 g/dL 16.2  Hematocrit 39.0 - 52.0 % 46.6  Platelets 150 - 400 K/uL 261   Lipid Panel     Component Value Date/Time   CHOL 207 (HH) 08/11/2008 1122   TRIG 97 08/11/2008 1122   HDL 40.1 08/11/2008 1122   CHOLHDL 5.2 CALC 08/11/2008 1122   VLDL 19 08/11/2008 1122   LDLCALC 135 (H) 06/27/2007 0920   LDLDIRECT 150.3 08/11/2008 1122   HEMOGLOBIN A1C No results found for: HGBA1C, MPG TSH No results for input(s): TSH in the last 8760 hours.   External Labs:  Cholesterol, total 167.000 M 01/14/2016 HDL 53.000 MG 01/14/2016 LDL-C 95.000 02/12/2019 Triglycerides 64.000 02/12/2019  02/20/2019: eGFR 73.970    Medications    Current Outpatient Medications  Medication Instructions  . ANORO ELLIPTA 62.5-25 MCG/INH AEPB 1 puff, Inhalation, Daily  . aspirin  81 mg, Oral, Daily  . CRESTOR 20 MG tablet No dose, route, or frequency recorded.  . ezetimibe (ZETIA) 10 mg, Oral, Daily  . hydrochlorothiazide (MICROZIDE) 12.5 mg, Oral, Daily  . irbesartan (AVAPRO) 150 mg, Oral, Daily after supper  . PROAIR RESPICLICK 283 (90 BASE) MCG/ACT AEPB No dose, route, or frequency recorded.    Cardiac Studies:   Chest CT for lung cancer screening 04/04/2017, compared to 03/31/2016: Cardiovascular: The heart size is normal. No pericardial effusion. Coronary artery calcification is noted. Ascending thoracic aorta measures 4.1 cm maximum diameter. Atherosclerotic calcification is noted in the wall of the thoracic aorta.  No change from prior study   Nuclear stress test  Lexiscan myoview stress test 11/03/2017: 1. The resting electrocardiogram demonstrated normal sinus rhythm, normal resting conduction, no resting arrhythmias and normal rest repolarization. Stress EKG is non-diagnostic for ischemia as it a pharmacologic stress using Lexiscan. Patient developed frequent PVC in bigeminal pattern in recovery. Stress symptoms included dizziness. 2. Myocardial perfusion imaging is normal. Overall left ventricular systolic function was normal without regional wall motion abnormalities. The left ventricular ejection fraction was 66%. This is an intermediate risk study, clinical correlation recommended.  Abdominal aortic duplex 01/04/2018: The maximum aorta diameter is 2.48 cm (prox). Ectasia of the abdominal aorta measuring 2.48 x 2.4 x 2.47 cm is seen. Focal plaque observed in the proximal, mid and distal aorta. Peak systolic velocities in the left internal iliac artery are mildly increased suggests <50% stenosis. Enlarged inferior vena cava with normal respiratory variation suggests elevated central venous  pressure.  Echo- 01/15/2019 1. Normal LV systolic function with EF 56%. Left ventricle cavity is normal in size. Normal global wall motion. Normal diastolic filling pattern. Calculated EF 56%. 2. Trace aortic regurgitation. Mild aortic valve leaflet thickening. 3. Moderate (Grade II) mitral regurgitation. 4. Mild tricuspid regurgitation. No evidence of pulmonary hypertension. 5. IVC is dilated with respiratory variation. 6. The aortic root is normal in size. Whole ascending aorta is not included in study. Pt's records show 4.1 cm aneurysm of ascending aorta by CT scan in past. Consider follow up CT if clinically indicated. 7. No diagnostic change c.f. echo. of 12/07/2017.  Chest CT for lung cancer screening 05/07/2019:  Cardiovascular: Aortic atherosclerosis. Normal heart size, without pericardial effusion. Multivessel coronary artery atherosclerosis.  EKG: EKG 01/31/2020: Normal sinus rhythm with rate of 80 bpm, left atrial enlargement, normal axis.  Right bundle branch block.  No significant change from prior EKG.   Assessment     ICD-10-CM   1. Dyspnea on exertion  R06.00 EKG 12-Lead  2. Coronary artery calcification seen on CAT scan  I25.10   3. Thoracic ascending aortic aneurysm (HCC)  I71.2   4. COPD  GOLD III/ still smoking   J44.9   5. Hyperlipidemia, group A  E78.00   6. Moderate mitral regurgitation  I34.0 PCV ECHOCARDIOGRAM COMPLETE    Recommendations:   Trelyn Vanderlinde  is a 69 y.o. male  with COPD with centrilobular emphysema, chronic shortness of breath and ?4.1 cm Asc Aortic aneurysm and hypertension presents for a 1 year follow up.  He has a history of ascending aortic aneurysm by CT scan done for lung cancer screening and noted to have ascending aortic dilatation of 4.1 cm in 2017 with subsequent scans not mentioning AAA, also echocardiogram in 2019 and also in 2020 also did not reveal ascending aortic aneurysm.  Patient presenting for mild ascending aortic aneurysm  measuring 4.1 cm by CT scan of the  chest done in 03/02/2017.  However 04/17/2018 CT and 05/07/2019 CT did not mention this.  He is scheduled for repeat CT chest for lung cancer screening later this year. Will continue to follow annual CT chest results. However, suspect aortic aneurysm noted in 2018 to be an error as it has not been noted since.   Coronary calcification also noted on CT, recommend risk factor control. Patient currently taking Crestor 63m and Zetia 568m No recent lipid panel to assess control. Patient prefers to follow with PCP to manage lipids. May consider increasing Zetia to 10 mg if lipids elevated on next panel. Also continue aspirin. Hypertension is managed by PCP and well controlled on irbesartan and HCTZ.   Suspect dyspnea is due to underlying COPD. Encouraged patient to follow up with PCP in regards to management, may consider pulmonology referral.   Will obtain echo to d.u ob mitral regurgitation as an etiology of dyspnea as last echo was 01/15/2019. Will call with results of echo. Follow up in 1 year, sooner if needed.   Patient was seen in collaboration with Dr. GaEinar GipHe also reviewed patient's chart and examined the patient. Dr. GaEinar Gips in agreement of the plan.     CeAlethia BertholdPA-C 01/31/2020, 2:18 PM Office: [phone]

## 2020-02-27 DIAGNOSIS — Z125 Encounter for screening for malignant neoplasm of prostate: Secondary | ICD-10-CM | POA: Diagnosis not present

## 2020-02-27 DIAGNOSIS — I10 Essential (primary) hypertension: Secondary | ICD-10-CM | POA: Diagnosis not present

## 2020-03-03 DIAGNOSIS — Z23 Encounter for immunization: Secondary | ICD-10-CM | POA: Diagnosis not present

## 2020-03-03 DIAGNOSIS — E875 Hyperkalemia: Secondary | ICD-10-CM | POA: Diagnosis not present

## 2020-03-03 DIAGNOSIS — J439 Emphysema, unspecified: Secondary | ICD-10-CM | POA: Diagnosis not present

## 2020-03-03 DIAGNOSIS — R69 Illness, unspecified: Secondary | ICD-10-CM | POA: Diagnosis not present

## 2020-03-03 DIAGNOSIS — Z0001 Encounter for general adult medical examination with abnormal findings: Secondary | ICD-10-CM | POA: Diagnosis not present

## 2020-03-03 DIAGNOSIS — I6529 Occlusion and stenosis of unspecified carotid artery: Secondary | ICD-10-CM | POA: Diagnosis not present

## 2020-03-03 DIAGNOSIS — N400 Enlarged prostate without lower urinary tract symptoms: Secondary | ICD-10-CM | POA: Diagnosis not present

## 2020-03-03 DIAGNOSIS — I10 Essential (primary) hypertension: Secondary | ICD-10-CM | POA: Diagnosis not present

## 2020-03-03 DIAGNOSIS — E7800 Pure hypercholesterolemia, unspecified: Secondary | ICD-10-CM | POA: Diagnosis not present

## 2020-03-31 DIAGNOSIS — Z1212 Encounter for screening for malignant neoplasm of rectum: Secondary | ICD-10-CM | POA: Diagnosis not present

## 2020-03-31 DIAGNOSIS — Z1211 Encounter for screening for malignant neoplasm of colon: Secondary | ICD-10-CM | POA: Diagnosis not present

## 2020-04-07 ENCOUNTER — Other Ambulatory Visit: Payer: Self-pay

## 2020-04-07 ENCOUNTER — Encounter (HOSPITAL_BASED_OUTPATIENT_CLINIC_OR_DEPARTMENT_OTHER): Payer: Self-pay

## 2020-04-07 ENCOUNTER — Observation Stay (HOSPITAL_BASED_OUTPATIENT_CLINIC_OR_DEPARTMENT_OTHER)
Admission: EM | Admit: 2020-04-07 | Discharge: 2020-04-08 | Disposition: A | Discharge status: Home / Self Care | Payer: Medicare HMO | Attending: Internal Medicine | Admitting: Internal Medicine

## 2020-04-07 ENCOUNTER — Emergency Department (HOSPITAL_BASED_OUTPATIENT_CLINIC_OR_DEPARTMENT_OTHER): Payer: Medicare HMO

## 2020-04-07 DIAGNOSIS — Z20822 Contact with and (suspected) exposure to covid-19: Secondary | ICD-10-CM | POA: Insufficient documentation

## 2020-04-07 DIAGNOSIS — R0602 Shortness of breath: Secondary | ICD-10-CM | POA: Diagnosis not present

## 2020-04-07 DIAGNOSIS — F4322 Adjustment disorder with anxiety: Secondary | ICD-10-CM | POA: Diagnosis present

## 2020-04-07 DIAGNOSIS — F17210 Nicotine dependence, cigarettes, uncomplicated: Secondary | ICD-10-CM | POA: Diagnosis not present

## 2020-04-07 DIAGNOSIS — J9602 Acute respiratory failure with hypercapnia: Secondary | ICD-10-CM | POA: Diagnosis not present

## 2020-04-07 DIAGNOSIS — J439 Emphysema, unspecified: Secondary | ICD-10-CM | POA: Diagnosis not present

## 2020-04-07 DIAGNOSIS — J9601 Acute respiratory failure with hypoxia: Secondary | ICD-10-CM

## 2020-04-07 DIAGNOSIS — J441 Chronic obstructive pulmonary disease with (acute) exacerbation: Principal | ICD-10-CM | POA: Diagnosis present

## 2020-04-07 DIAGNOSIS — R69 Illness, unspecified: Secondary | ICD-10-CM | POA: Diagnosis not present

## 2020-04-07 DIAGNOSIS — E785 Hyperlipidemia, unspecified: Secondary | ICD-10-CM | POA: Insufficient documentation

## 2020-04-07 DIAGNOSIS — I10 Essential (primary) hypertension: Secondary | ICD-10-CM | POA: Diagnosis present

## 2020-04-07 DIAGNOSIS — J449 Chronic obstructive pulmonary disease, unspecified: Secondary | ICD-10-CM | POA: Diagnosis present

## 2020-04-07 DIAGNOSIS — Z7982 Long term (current) use of aspirin: Secondary | ICD-10-CM | POA: Diagnosis not present

## 2020-04-07 DIAGNOSIS — Z79899 Other long term (current) drug therapy: Secondary | ICD-10-CM | POA: Insufficient documentation

## 2020-04-07 DIAGNOSIS — J9600 Acute respiratory failure, unspecified whether with hypoxia or hypercapnia: Secondary | ICD-10-CM | POA: Diagnosis present

## 2020-04-07 LAB — CBC WITH DIFFERENTIAL/PLATELET
Abs Immature Granulocytes: 0.12 10*3/uL — ABNORMAL HIGH (ref 0.00–0.07)
Basophils Absolute: 0.1 10*3/uL (ref 0.0–0.1)
Basophils Relative: 0 %
Eosinophils Absolute: 0 10*3/uL (ref 0.0–0.5)
Eosinophils Relative: 0 %
HCT: 52.3 % — ABNORMAL HIGH (ref 39.0–52.0)
Hemoglobin: 17.2 g/dL — ABNORMAL HIGH (ref 13.0–17.0)
Immature Granulocytes: 1 %
Lymphocytes Relative: 5 %
Lymphs Abs: 1 10*3/uL (ref 0.7–4.0)
MCH: 29.7 pg (ref 26.0–34.0)
MCHC: 32.9 g/dL (ref 30.0–36.0)
MCV: 90.3 fL (ref 80.0–100.0)
Monocytes Absolute: 1.1 10*3/uL — ABNORMAL HIGH (ref 0.1–1.0)
Monocytes Relative: 6 %
Neutro Abs: 16.9 10*3/uL — ABNORMAL HIGH (ref 1.7–7.7)
Neutrophils Relative %: 88 %
Platelets: 293 10*3/uL (ref 150–400)
RBC: 5.79 MIL/uL (ref 4.22–5.81)
RDW: 13.9 % (ref 11.5–15.5)
WBC: 19.1 10*3/uL — ABNORMAL HIGH (ref 4.0–10.5)
nRBC: 0 % (ref 0.0–0.2)

## 2020-04-07 LAB — I-STAT VENOUS BLOOD GAS, ED
Acid-base deficit: 4 mmol/L — ABNORMAL HIGH (ref 0.0–2.0)
Bicarbonate: 22.9 mmol/L (ref 20.0–28.0)
Calcium, Ion: 1.19 mmol/L (ref 1.15–1.40)
HCT: 50 % (ref 39.0–52.0)
Hemoglobin: 17 g/dL (ref 13.0–17.0)
O2 Saturation: 81 %
Patient temperature: 98
Potassium: 3.7 mmol/L (ref 3.5–5.1)
Sodium: 136 mmol/L (ref 135–145)
TCO2: 24 mmol/L (ref 22–32)
pCO2, Ven: 44.9 mmHg (ref 44.0–60.0)
pH, Ven: 7.313 (ref 7.250–7.430)
pO2, Ven: 48 mmHg — ABNORMAL HIGH (ref 32.0–45.0)

## 2020-04-07 LAB — BASIC METABOLIC PANEL
Anion gap: 15 (ref 5–15)
BUN: 18 mg/dL (ref 8–23)
CO2: 20 mmol/L — ABNORMAL LOW (ref 22–32)
Calcium: 9.8 mg/dL (ref 8.9–10.3)
Chloride: 100 mmol/L (ref 98–111)
Creatinine, Ser: 0.89 mg/dL (ref 0.61–1.24)
GFR, Estimated: >60 mL/min (ref >60)
Glucose, Bld: 126 mg/dL — ABNORMAL HIGH (ref 70–99)
Potassium: 4.3 mmol/L (ref 3.5–5.1)
Sodium: 135 mmol/L (ref 135–145)

## 2020-04-07 LAB — BRAIN NATRIURETIC PEPTIDE: B Natriuretic Peptide: 183.6 pg/mL — ABNORMAL HIGH (ref 0.0–100.0)

## 2020-04-07 LAB — RESPIRATORY PANEL BY RT PCR (FLU A&B, COVID)
Influenza A by PCR: NEGATIVE
Influenza B by PCR: NEGATIVE
SARS Coronavirus 2 by RT PCR: NEGATIVE

## 2020-04-07 LAB — TROPONIN I (HIGH SENSITIVITY)
Troponin I (High Sensitivity): 21 ng/L — ABNORMAL HIGH (ref <18)
Troponin I (High Sensitivity): 18 ng/L — ABNORMAL HIGH (ref <18)

## 2020-04-07 MED ORDER — PREDNISONE 20 MG PO TABS: 40.0000 mg | ORAL_TABLET | Freq: Every day | ORAL | Status: DC

## 2020-04-07 MED ORDER — EZETIMIBE 10 MG PO TABS
10.0000 mg | ORAL_TABLET | Freq: Every day | ORAL | Status: DC
  Administered 2020-04-08: 10 mg via ORAL
  Filled 2020-04-07: qty 1

## 2020-04-07 MED ORDER — SODIUM CHLORIDE 0.45 % IV SOLN: INTRAVENOUS | Status: DC

## 2020-04-07 MED ORDER — UMECLIDINIUM-VILANTEROL 62.5-25 MCG/INH IN AEPB
1.0000 | INHALATION_SPRAY | Freq: Every day | RESPIRATORY_TRACT | Status: DC
  Administered 2020-04-08: 08:00:00 1 via RESPIRATORY_TRACT
  Filled 2020-04-07: qty 14

## 2020-04-07 MED ORDER — MAGNESIUM SULFATE 2 GM/50ML IV SOLN
2.0000 g | Freq: Once | INTRAVENOUS | Status: AC
  Administered 2020-04-07: 2 g via INTRAVENOUS
  Filled 2020-04-07: qty 50

## 2020-04-07 MED ORDER — ALBUTEROL (5 MG/ML) CONTINUOUS INHALATION SOLN: 10.0000 mg/h | INHALATION_SOLUTION | RESPIRATORY_TRACT | Status: AC

## 2020-04-07 MED ORDER — IRBESARTAN 150 MG PO TABS: 150.0000 mg | ORAL_TABLET | Freq: Every day | ORAL | Status: DC

## 2020-04-07 MED ORDER — ASPIRIN EC 81 MG PO TBEC
81.0000 mg | DELAYED_RELEASE_TABLET | Freq: Every day | ORAL | Status: DC
  Administered 2020-04-08: 81 mg via ORAL
  Filled 2020-04-07: qty 1

## 2020-04-07 MED ORDER — LORAZEPAM 2 MG/ML IJ SOLN
0.5000 mg | Freq: Once | INTRAMUSCULAR | Status: AC
  Administered 2020-04-07: 0.5 mg via INTRAVENOUS
  Filled 2020-04-07: qty 1

## 2020-04-07 MED ORDER — IPRATROPIUM-ALBUTEROL 0.5-2.5 (3) MG/3ML IN SOLN
3.0000 mL | Freq: Four times a day (QID) | RESPIRATORY_TRACT | Status: DC
  Administered 2020-04-08 (×3): 3 mL via RESPIRATORY_TRACT
  Filled 2020-04-07 (×3): qty 3

## 2020-04-07 MED ORDER — HYDROCHLOROTHIAZIDE 12.5 MG PO CAPS
12.5000 mg | ORAL_CAPSULE | Freq: Every day | ORAL | Status: DC
  Administered 2020-04-08: 12.5 mg via ORAL
  Filled 2020-04-07: qty 1

## 2020-04-07 MED ORDER — ENOXAPARIN SODIUM 40 MG/0.4ML ~~LOC~~ SOLN
40.0000 mg | SUBCUTANEOUS | Status: DC
  Administered 2020-04-07: 40 mg via SUBCUTANEOUS
  Filled 2020-04-07: qty 0.4

## 2020-04-07 MED ORDER — IPRATROPIUM-ALBUTEROL 0.5-2.5 (3) MG/3ML IN SOLN
RESPIRATORY_TRACT | Status: AC
  Administered 2020-04-07: 9 mL
  Filled 2020-04-07: qty 9

## 2020-04-07 MED ORDER — ALBUTEROL (5 MG/ML) CONTINUOUS INHALATION SOLN
INHALATION_SOLUTION | RESPIRATORY_TRACT | Status: AC
  Administered 2020-04-07: 10 mg/h via RESPIRATORY_TRACT
  Filled 2020-04-07: qty 20

## 2020-04-07 MED ORDER — METHYLPREDNISOLONE SODIUM SUCC 125 MG IJ SOLR
40.0000 mg | Freq: Four times a day (QID) | INTRAMUSCULAR | Status: AC
  Administered 2020-04-07 – 2020-04-08 (×4): 40 mg via INTRAVENOUS
  Filled 2020-04-07 (×4): qty 2

## 2020-04-07 MED ORDER — ROSUVASTATIN CALCIUM 20 MG PO TABS
20.0000 mg | ORAL_TABLET | Freq: Every day | ORAL | Status: DC
  Administered 2020-04-08: 20 mg via ORAL
  Filled 2020-04-07: qty 1

## 2020-04-07 MED ORDER — ALBUTEROL (5 MG/ML) CONTINUOUS INHALATION SOLN: 10.0000 mg/h | INHALATION_SOLUTION | Freq: Once | RESPIRATORY_TRACT | Status: AC

## 2020-04-07 MED ORDER — METHYLPREDNISOLONE SODIUM SUCC 125 MG IJ SOLR
125.0000 mg | Freq: Once | INTRAMUSCULAR | Status: AC
  Administered 2020-04-07: 125 mg via INTRAVENOUS
  Filled 2020-04-07: qty 2

## 2020-04-07 MED ORDER — LORAZEPAM 2 MG/ML IJ SOLN: 0.5000 mg | Freq: Four times a day (QID) | INTRAMUSCULAR | Status: DC | PRN

## 2020-04-07 NOTE — ED Notes (Signed)
Pt removed from Bipap at this time. CAT of 10mg  albuterol started.

## 2020-04-07 NOTE — ED Notes (Signed)
Report given to [facility]RT at [facility] on patient transported on Bipap.

## 2020-04-07 NOTE — ED Notes (Signed)
Blood cultures drawn earlier today, sent to lab.  Orders placed to send, lab notified

## 2020-04-07 NOTE — Progress Notes (Signed)
Pt. arrived [facility] via [facility] and placed on BiPAP/V-60 @same  settings, protective pad placed over pts. bridge of nose to limit breakdown with xtra left in room along with HFNC([place])&SW for alternate therapy if needed, RN aware @ bedside, tolerating well.

## 2020-04-07 NOTE — ED Notes (Signed)
Patient placed back on Bipap due to no improvement with increase WOB. Pt has decided he will stay for admission.

## 2020-04-07 NOTE — H&P (Signed)
History and Physical    Timothy Maldonado ZOX:[id number] DOB: March 07, 1951 DOA: 04/07/2020  PCP: [name], MD (Confirm with patient/family/NH records and if not entered, this has to be entered at [facility] point of entry) Patient coming from: transfer from [facility], he had presented from home  I have personally briefly reviewed patient's old medical records in [facility]  Chief Complaint: SOB  HPI: Timothy Maldonado is a 69 y.o. male with medical history significant of  COPD-emphysema by CT chest and PFTs, hyperlipidemia, hypertension, atherosclerosis, and Thoracic AA who presents emergency department with shortness of breath.  At his baseline he gets SOB with exertion. For the past week he has been much worse unable to complete household tasks without resting. States that this morning he awoke and began coughing.  He states that he could not stop and then had severe difficulty breathing.  He was unable to get his oxygen saturations above 90% at home and presented to the emergency department.  He is a chronic daily smoker smoking 10-20 cig/day.  He is on albuterol and Anoro Ellipta.  Patient states that his breathing seemed a bit worse yesterday and that at times he felt "like I was mechanically breathing and had to think about each breath in and out."  He has been triple vaccinated against the coronavirus and also had his flu vaccine earlier this year.  He denies any fevers, chills, chest pain, history of CHF or volume overload.  He states that he has never been hospitalized or even to the emergency department for his breathing before.  ED Course: T 97.7  129/94  HR 100 RR 24 and labored. ED-PA exam reveal patient to be in acute distress with tachypnea, Acc mm use. Lab revealed serum glucose of 126, Leukocytosis of 19.1, Hgb 17.2 with 88/8/6.  Venous Blood gas with pH 7.3 PCO2 45, PO2 48Covid NEGATIVE. CXR revealed hyperexpansion c/w emphysema w/o ASD. He was administered IV solumedrol 125mg , continuous  Albuterol neb x 1 hr. He was started on BiPAP for hypercarbia and transferred to [facility] for continue tx.  Review of Systems: As per HPI otherwise 10 point review of systems negative.    Past Medical History:  Diagnosis Date  . Bursitis   . Carotid atherosclerosis   . COPD (chronic obstructive pulmonary disease) (HCC)   . High cholesterol   . Hyperlipidemia, group A 07/21/2018  . Hypertension   . Libido, decreased   . Low back pain   . Plantar wart   . Screening for abdominal aortic aneurysm (AAA) performed 07/21/2018   Abdominal aortic duplex 01/04/2018: The maximum aorta diameter is 2.48 cm (prox).  Ectasia of the abdominal aorta measuring 2.48 x 2.4 x 2.47 cm is seen.  Focal plaque observed in the proximal, mid and distal aorta. Peak systolic velocities in the left internal iliac artery are mildly increased suggests <50% stenosis. Enlarged inferior vena cava with normal respiratory variation suggests elevated   . Thoracic ascending aortic aneurysm (HCC) 03/31/2016   CT chest for lung cancer screening: 4.1 Asc Ao Aneurysm. 3 vessel coronary calcification    History reviewed. No pertinent surgical history.  Soc Hx -  Married 50 years, has a son 66, lost a daughter at age 86 2/2 stroke complications. He is raising nephews age 19 & 69. 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     reports that he has been  smoking cigarettes. He has a 48.00 pack-year smoking history. He has never used smokeless tobacco. He reports current alcohol use. He reports that he does not use drugs.  Allergies  Allergen Reactions  . Sulfonamide Derivatives     Childhood allergy    Family History  Problem Relation Age of Onset  . Heart disease Father      Prior to Admission medications   Medication Sig Start Date End Date Taking? Authorizing Provider  albuterol (VENTOLIN HFA) 108 (90 Base) MCG/ACT inhaler  Inhale 2 puffs into the lungs every 6 (six) hours as needed for wheezing or shortness of breath.   Yes [provider]  ANORO ELLIPTA 62.5-25 MCG/INH AEPB Inhale 1 puff into the lungs daily. 07/04/18  Yes [provider]  aspirin 81 MG tablet Take 81 mg by mouth daily.   Yes [provider]  CRESTOR 20 MG tablet Take 20 mg by mouth daily.  01/05/15  Yes [provider]  ezetimibe (ZETIA) 10 MG tablet Take 10 mg by mouth daily. 11/20/19  Yes [provider]  hydrochlorothiazide (MICROZIDE) 12.5 MG capsule Take 12.5 mg by mouth daily.   Yes [provider]  irbesartan (AVAPRO) 150 MG tablet Take 150 mg by mouth daily after supper.    Yes [provider]    Physical Exam: Vitals:   04/07/20 1633 04/07/20 1645 04/07/20 1700 04/07/20 1954  BP: 111/67 (!) 107/57 (!) 95/56 (!) 129/94  Pulse: 91 82 82 100  Resp: (!) 26 (!) 23 16 (!) 24  Temp:    97.7 F (36.5 C)  TempSrc:    Oral  SpO2: 99% 98% 97% 98%  Weight:      Height:         Vitals:   04/07/20 1633 04/07/20 1645 04/07/20 1700 04/07/20 1954  BP: 111/67 (!) 107/57 (!) 95/56 (!) 129/94  Pulse: 91 82 82 100  Resp: (!) 26 (!) 23 16 (!) 24  Temp:    97.7 F (36.5 C)  TempSrc:    Oral  SpO2: 99% 98% 97% 98%  Weight:      Height:       General  Thin, "pink-puffer," temporal wasting noted, appears emaciated. He is not in respiratory distress but is on BiPAP.  Eyes: PERRL, lids and conjunctivae normal ENMT: Deferred 2/2 BiPAP mask.  Neck: normal, supple, no masses, no thyromegaly Respiratory: Decreased breath sounds, no wheezing noted. Mild increased WOB but no neck retraction or use of abdominal musculature. Cardiovascular: Regular rate and rhythm, no murmurs / rubs / gallops. No extremity edema. 2+ pedal pulses. No carotid bruits.  Abdomen: no tenderness, no masses palpated. No hepatosplenomegaly. Bowel sounds positive.  Musculoskeletal: no clubbing / cyanosis. No joint  deformity upper and lower extremities. Good ROM,  Normal muscle tone.  Skin: no rashes, lesions, ulcers. No induration Neurologic: CN 2-12 grossly intact.  Strength 5/5 in all 4.  Psychiatric: Normal judgment and insight. Alert and oriented x 3. Normal mood.     Labs on Admission: I have personally reviewed following labs and imaging studies  CBC: Recent Labs  Lab 04/07/20 0912 04/07/20 1421  WBC 19.1*  --   NEUTROABS 16.9*  --   HGB 17.2* 17.0  HCT 52.3* 50.0  MCV 90.3  --   PLT 293  --    Basic Metabolic Panel: Recent Labs  Lab 04/07/20 0912 04/07/20 1421  NA 135 136  K 4.3 3.7  CL 100  --   CO2  20*  --   GLUCOSE 126*  --   BUN 18  --   CREATININE 0.89  --   CALCIUM 9.8  --    GFR: Estimated Creatinine Clearance: 71.3 mL/min (by C-G formula based on SCr of 0.89 mg/dL). Liver Function Tests: No results for input(s): AST, ALT, ALKPHOS, BILITOT, PROT, ALBUMIN in the last 168 hours. No results for input(s): LIPASE, AMYLASE in the last 168 hours. No results for input(s): AMMONIA in the last 168 hours. Coagulation Profile: No results for input(s): INR, PROTIME in the last 168 hours. Cardiac Enzymes: No results for input(s): CKTOTAL, CKMB, CKMBINDEX, TROPONINI in the last 168 hours. BNP (last 3 results) No results for input(s): PROBNP in the last 8760 hours. HbA1C: No results for input(s): HGBA1C in the last 72 hours. CBG: No results for input(s): GLUCAP in the last 168 hours. Lipid Profile: No results for input(s): CHOL, HDL, LDLCALC, TRIG, CHOLHDL, LDLDIRECT in the last 72 hours. Thyroid Function Tests: No results for input(s): TSH, T4TOTAL, FREET4, T3FREE, THYROIDAB in the last 72 hours. Anemia Panel: No results for input(s): VITAMINB12, FOLATE, FERRITIN, TIBC, IRON, RETICCTPCT in the last 72 hours. Urine analysis: No results found for: COLORURINE, APPEARANCEUR, LABSPEC, PHURINE, GLUCOSEU, HGBUR, BILIRUBINUR, KETONESUR, PROTEINUR, UROBILINOGEN, NITRITE,  LEUKOCYTESUR  Radiological Exams on Admission: DG Chest Port 1 View  Result Date: 04/07/2020 CLINICAL DATA:  Shortness of breath EXAM: PORTABLE CHEST 1 VIEW COMPARISON:  Chest CT May 07, 2019 FINDINGS: Lungs are hyperexpanded. Underlying emphysematous changes better delineated on prior CT. No edema or airspace opacity. The heart size and pulmonary vascularity are within normal limits. No adenopathy. No bone lesions. IMPRESSION: Lungs hyperexpanded. No edema or airspace opacity. Heart size normal. Emphysema (ICD10-J43.9). Electronically Signed   By: Bretta Bang III M.D.   On: 04/07/2020 09:48    EKG: Independently reviewed. Sinus tachycardia, RBBB, LPFB  Assessment/Plan Active Problems:   Adjustment disorder with anxiety   Essential hypertension   COPD  GOLD III/ still smoking    Respiratory failure, acute (HCC)  (please populate well all problems here in Problem List. (For example, if patient is on BP meds at home and you resume or decide to hold them, it is a problem that needs to be her. Same for CAD, COPD, HLD and so on)   1. Pulmonary - long standing empysema with previous PFTs. He continues to smoke. He was referred to Dr. Sherene Sires, pulmonary consultant, but is appointment is later in November. He presents in respiratory hypercarbic failure Plan Continue BiPAP until sats are better - ABG in AM  Solumedrol 40 mg q 6 x 4  Duonebs q 6  Continue Anoro Ellipta once a day  Pulmonary consult  2. HTN- continue home meds.  DVT prophylaxis: lovenox Code Status: full code  Family Communication: spoke with Mrs. Chaidez - explained tx plan. Answered her questions  Disposition Plan: home when stable  Consults called: Please call pulmonary for consult in AM  Admission status: inpatient/tele    Illene Regulus MD [facility] Pager [phone]  If 7PM-7AM, please contact night-coverage [email or web address] Password [facility]  04/07/2020, 8:55 PM

## 2020-04-07 NOTE — ED Triage Notes (Signed)
Pt reports chronic shortness of breath that got worse yesterday with hx of COPD and emphysema. Pt A&O.
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
## 2020-04-07 NOTE — ED Notes (Signed)
Crystal, RT, will try to wean patient off the BiPap.

## 2020-04-07 NOTE — ED Notes (Signed)
Wife, Kristi Hyer, is notified of the transfer and room assignment.

## 2020-04-08 DIAGNOSIS — I10 Essential (primary) hypertension: Secondary | ICD-10-CM | POA: Diagnosis not present

## 2020-04-08 DIAGNOSIS — J9601 Acute respiratory failure with hypoxia: Secondary | ICD-10-CM | POA: Diagnosis not present

## 2020-04-08 DIAGNOSIS — J441 Chronic obstructive pulmonary disease with (acute) exacerbation: Secondary | ICD-10-CM | POA: Diagnosis present

## 2020-04-08 DIAGNOSIS — J449 Chronic obstructive pulmonary disease, unspecified: Secondary | ICD-10-CM | POA: Diagnosis not present

## 2020-04-08 DIAGNOSIS — F4322 Adjustment disorder with anxiety: Secondary | ICD-10-CM | POA: Diagnosis not present

## 2020-04-08 DIAGNOSIS — R69 Illness, unspecified: Secondary | ICD-10-CM | POA: Diagnosis not present

## 2020-04-08 LAB — BASIC METABOLIC PANEL
Anion gap: 13 (ref 5–15)
BUN: 25 mg/dL — ABNORMAL HIGH (ref 8–23)
CO2: 23 mmol/L (ref 22–32)
Calcium: 9.3 mg/dL (ref 8.9–10.3)
Chloride: 101 mmol/L (ref 98–111)
Creatinine, Ser: 0.86 mg/dL (ref 0.61–1.24)
GFR, Estimated: >60 mL/min (ref >60)
Glucose, Bld: 126 mg/dL — ABNORMAL HIGH (ref 70–99)
Potassium: 4.1 mmol/L (ref 3.5–5.1)
Sodium: 137 mmol/L (ref 135–145)

## 2020-04-08 LAB — BLOOD GAS, ARTERIAL
Acid-Base Excess: 1.2 mmol/L (ref 0.0–2.0)
Bicarbonate: 25.9 mmol/L (ref 20.0–28.0)
Drawn by: 225631
O2 Content: 3 L/min
O2 Saturation: 97 %
Patient temperature: 98.9
pCO2 arterial: 43.3 mmHg (ref 32.0–48.0)
pH, Arterial: 7.394 (ref 7.350–7.450)
pO2, Arterial: 91.9 mmHg (ref 83.0–108.0)

## 2020-04-08 LAB — CBC
HCT: 45.2 % (ref 39.0–52.0)
Hemoglobin: 15 g/dL (ref 13.0–17.0)
MCH: 30.5 pg (ref 26.0–34.0)
MCHC: 33.2 g/dL (ref 30.0–36.0)
MCV: 91.9 fL (ref 80.0–100.0)
Platelets: 245 10*3/uL (ref 150–400)
RBC: 4.92 MIL/uL (ref 4.22–5.81)
RDW: 13.9 % (ref 11.5–15.5)
WBC: 15.3 10*3/uL — ABNORMAL HIGH (ref 4.0–10.5)
nRBC: 0 % (ref 0.0–0.2)

## 2020-04-08 LAB — HIV ANTIBODY (ROUTINE TESTING W REFLEX): HIV Screen 4th Generation wRfx: NONREACTIVE

## 2020-04-08 MED ORDER — PREDNISONE 10 MG PO TABS: 10.0000 mg | ORAL_TABLET | Freq: Every day | ORAL | 0 refills | Status: DC

## 2020-04-08 MED ORDER — ALPRAZOLAM 0.25 MG PO TABS: 0.2500 mg | ORAL_TABLET | Freq: Two times a day (BID) | ORAL | 0 refills | Status: AC | PRN

## 2020-04-08 MED ORDER — ENSURE ENLIVE PO LIQD
237.0000 mL | Freq: Two times a day (BID) | ORAL | Status: DC
  Administered 2020-04-08: 237 mL via ORAL

## 2020-04-08 MED ORDER — IPRATROPIUM-ALBUTEROL 0.5-2.5 (3) MG/3ML IN SOLN
3.0000 mL | Freq: Two times a day (BID) | RESPIRATORY_TRACT | 2 refills | Status: DC | PRN
Start: 2020-04-08 — End: 2020-04-21

## 2020-04-08 NOTE — Progress Notes (Signed)
SATURATION QUALIFICATIONS: (This note is used to comply with regulatory documentation for home oxygen)  Patient Saturations on Room Air at Rest = 93%  Patient Saturations on Room Air while Ambulating = 85%  Patient Saturations on 2 Liters of oxygen while Ambulating = 88%  Please briefly explain why patient needs home oxygen:  COPD patient with desaturation with ambulation to 85% RA

## 2020-04-08 NOTE — TOC Transition Note (Signed)
Transition of Care [facility]) - CM/SW Discharge Note   Patient Details  Name: Timothy Maldonado MRN: [id number] Date of Birth: [birth year]  Transition of Care [facility]) CM/SW Contact:  Darleene Cleaver, LCSW Phone Number: 04/08/2020, 3:10 PM   Clinical Narrative:     CSW was informed that patient will need oxygen and a nebulizer.  CSW contacted [employer] and they are able to provide the equipment and will deliver it before he leaves.  CSW spoke to patient and his wife, they did not have any other questions.  CSW signing off, patient will be discharging back home.  Final next level of care: Home/Self Care Barriers to Discharge: Barriers Resolved   Patient Goals and CMS Choice Patient states their goals for this hospitalization and ongoing recovery are:: To return back home with oxygen CMS Medicare.gov Compare Post Acute Care list provided to:: Patient Choice offered to / list presented to : Patient, Spouse  Discharge Placement                       Discharge Plan and Services                DME Arranged: Oxygen, Nebulizer/meds DME Agency: [employer] Date DME Agency Contacted: 04/08/20 Time DME Agency Contacted: 1200 Representative spoke with at DME Agency: Ian Malkin HH Arranged: NA          Social Determinants of Health (SDOH) Interventions     Readmission Risk Interventions No flowsheet data found.

## 2020-04-08 NOTE — Discharge Summary (Addendum)
Physician Discharge Summary  Timothy Maldonado QMV:[id number] DOB: [birth year] DOA: 04/07/2020  PCP: [name], MD  Admit date: 04/07/2020 Discharge date: 04/08/2020  Admitted From: home Disposition:  home  Recommendations for Outpatient Follow-up:  1. Follow up with PCP in 1-2 weeks 2. Follow-up with Dr. Sherene Sires with pulmonology as scheduled  Home Health: None Equipment/Devices: Home oxygen, home nebulizer machine  Discharge Condition: Stable CODE STATUS: Full code Diet recommendation: Regular  HPI: Per admitting MD, Timothy Maldonado is a 69 y.o. 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 At his baseline he gets SOB with exertion. For the past week he has been much worse unable to complete household tasks without resting. States that this morning he awoke and began coughing. He states that he could not stop and then had severe difficulty breathing. He was unable to get his oxygen saturations above 90% at home and presented to the emergency department. He is a chronic daily smoker smoking 10-20 cig/day. He is on albuterol and Anoro Ellipta. Patient states that his breathing seemed a bit worse yesterday and that at times he felt "like I was mechanically breathing and had to think about each breath in and out." He has been triple vaccinated against the coronavirus and also had his flu vaccine earlier this year. He denies any fevers, chills, chest pain, history of CHF or volume overload. He states that he has never been hospitalized or even to the emergency department for his breathing before.  Hospital Course / Discharge diagnoses: COPD/emphysema -with acute exacerbation, patient wheezing on admission.  He was placed on nebulizers, steroids, he was initially requiring BiPAP but improved rapidly with treatment.  By hospital day 2,  his wheezing has completely resolved, he was satting well on room air and able to ambulate and feeling back to baseline.  He does however require oxygen on discharge as he desats into the mid 80s on room air with walking.  Patient tells me this is chronic, he has checked his O2 sats at home with ambulation for a while now and he is regularly seeing sats in the low 80s.  He will be discharged on a prednisone taper, was not started on antibiotics due to not having a productive cough and chest x-ray was clear on admission.  He will be set up with home oxygen as well as a nebulizer machine at home. Essential hypertension-resume home medications on discharge Hyperlipidemia-resume home medications on discharge Tobacco use -counseled to quit  Discharge Instructions  Allergies as of 04/08/2020      Reactions   Sulfonamide Derivatives    Childhood allergy      Medication List    TAKE these medications   ALPRAZolam 0.25 MG tablet Commonly known as: Xanax Take 1 tablet (0.25 mg total) by mouth 2 (two) times daily as needed for anxiety.   Anoro Ellipta 62.5-25 MCG/INH Aepb Generic drug: umeclidinium-vilanterol Inhale 1 puff into the lungs daily.   aspirin 81 MG tablet Take 81 mg by mouth daily.   Crestor 20 MG tablet Generic drug: rosuvastatin Take 20 mg by mouth daily.   ezetimibe 10 MG tablet Commonly known as: ZETIA Take 10 mg by mouth daily.   hydrochlorothiazide 12.5 MG capsule Commonly known as: MICROZIDE Take 12.5 mg by mouth daily.   ipratropium-albuterol 0.5-2.5 (3) MG/3ML Soln Commonly known as: DUONEB Take 3 mLs by nebulization every 12 (twelve) hours as  needed (wheezing, shortness of breath).   irbesartan 150 MG tablet Commonly known as: AVAPRO Take 150 mg by mouth daily after supper.   predniSONE 10 MG tablet Commonly known as: DELTASONE Take 1 tablet (10 mg total) by mouth daily. 4 tablets daily x 3 days then 3 daily x 3 days then 2 daily x 3 days then 1 daily x 3  days   Ventolin HFA 108 (90 Base) MCG/ACT inhaler Generic drug: albuterol Inhale 2 puffs into the lungs every 6 (six) hours as needed for wheezing or shortness of breath.            Durable Medical Equipment  (From admission, onward)         Start     Ordered   04/08/20 1201  For home use only DME oxygen  Once       Question Answer Comment  Length of Need Lifetime   Mode or (Route) Nasal cannula   Liters per Minute 2   Frequency Continuous (stationary and portable oxygen unit needed)   Oxygen delivery system Gas      04/08/20 1200   04/08/20 0959  For home use only DME Nebulizer machine  Once       Question Answer Comment  Patient needs a nebulizer to treat with the following condition COPD (chronic obstructive pulmonary disease) (HCC)   Length of Need Lifetime      04/08/20 8676          Follow-up Information    [name], MD. Schedule an appointment as soon as possible for a visit in 2 week(s).   Specialty: Internal Medicine Contact information: [address] [place] [address] [phone]               Consultations:  None   Procedures/Studies:  DG Chest Port 1 View  Result Date: 04/07/2020 CLINICAL DATA:  Shortness of breath EXAM: PORTABLE CHEST 1 VIEW COMPARISON:  Chest CT May 07, 2019 FINDINGS: Lungs are hyperexpanded. Underlying emphysematous changes better delineated on prior CT. No edema or airspace opacity. The heart size and pulmonary vascularity are within normal limits. No adenopathy. No bone lesions. IMPRESSION: Lungs hyperexpanded. No edema or airspace opacity. Heart size normal. Emphysema (ICD10-J43.9). Electronically Signed   By: Bretta Bang III M.D.   On: 04/07/2020 09:48     Subjective: - no chest pain, shortness of breath, no abdominal pain, nausea or vomiting. Asking to go home   Discharge Exam: BP 129/73 (BP Location: Left Arm)   Pulse 97   Temp 98.3 F (36.8 C) (Oral)   Resp (!) 22   Ht  5' 10.5" (1.791 m)   Wt 62.4 kg   SpO2 95%   BMI 19.45 kg/m   General: Pt is alert, awake, not in acute distress Cardiovascular: RRR, S1/S2 +, no rubs, no gallops Respiratory: Overall distant breath sounds but no wheezing, no crackles Abdominal: Soft, NT, ND, bowel sounds + Extremities: no edema, no cyanosis   The results of significant diagnostics from this hospitalization (including imaging, microbiology, ancillary and laboratory) are listed below for reference.     Microbiology: Recent Results (from the past 240 hour(s))  Respiratory Panel by RT PCR (Flu A&B, Covid) - Nasopharyngeal Swab     Status: None   Collection Time: 04/07/20  9:12 AM   Specimen: Nasopharyngeal Swab  Result Value Ref Range Status   SARS Coronavirus 2 by RT PCR NEGATIVE NEGATIVE Final    Comment: (NOTE) SARS-CoV-2 target nucleic  acids are NOT DETECTED.  The SARS-CoV-2 RNA is generally detectable in upper respiratoy specimens during the acute phase of infection. The lowest concentration of SARS-CoV-2 viral copies this assay can detect is 131 copies/mL. A negative result does not preclude SARS-Cov-2 infection and should not be used as the sole basis for treatment or other patient management decisions. A negative result may occur with  improper specimen collection/handling, submission of specimen other than nasopharyngeal swab, presence of viral mutation(s) within the areas targeted by this assay, and inadequate number of viral copies (<131 copies/mL). A negative result must be combined with clinical observations, patient history, and epidemiological information. The expected result is Negative.  Fact Sheet for Patients:  https://www.moore.com/  Fact Sheet for Healthcare Providers:  https://www.young.biz/  This test is no t yet approved or cleared by the [place] FDA and  has been authorized for detection and/or diagnosis of SARS-CoV-2 by FDA under an  Emergency Use Authorization (EUA). This EUA will remain  in effect (meaning this test can be used) for the duration of the COVID-19 declaration under Section 564(b)(1) of the Act, 21 U.S.C. section 360bbb-3(b)(1), unless the authorization is terminated or revoked sooner.     Influenza A by PCR NEGATIVE NEGATIVE Final   Influenza B by PCR NEGATIVE NEGATIVE Final    Comment: (NOTE) The Xpert Xpress SARS-CoV-2/FLU/RSV assay is intended as an aid in  the diagnosis of influenza from Nasopharyngeal swab specimens and  should not be used as a sole basis for treatment. Nasal washings and  aspirates are unacceptable for Xpert Xpress SARS-CoV-2/FLU/RSV  testing.  Fact Sheet for Patients: https://www.moore.com/  Fact Sheet for Healthcare Providers: https://www.young.biz/  This test is not yet approved or cleared by the [place] FDA and  has been authorized for detection and/or diagnosis of SARS-CoV-2 by  FDA under an Emergency Use Authorization (EUA). This EUA will remain  in effect (meaning this test can be used) for the duration of the  Covid-19 declaration under Section 564(b)(1) of the Act, 21  U.S.C. section 360bbb-3(b)(1), unless the authorization is  terminated or revoked. Performed at [facility], [address] [place], [place] [address]   Blood culture (routine x 2)     Status: None (Preliminary result)   Collection Time: 04/07/20  9:15 AM   Specimen: Right Antecubital; Blood  Result Value Ref Range Status   Specimen Description   Final    RIGHT ANTECUBITAL BLOOD Performed at [facility], [address] [place], [place] [address]    Special Requests   Final    BOTTLES DRAWN AEROBIC AND ANAEROBIC Blood Culture adequate volume Performed at [facility], [address] [place], [place] [address]    Culture   Final    NO GROWTH < 12 HOURS Performed at [facility], [address]  [place], [place] [address]    Report Status PENDING  Incomplete  Blood culture (routine x 2)     Status: None (Preliminary result)   Collection Time: 04/07/20  9:25 AM   Specimen: BLOOD LEFT FOREARM  Result Value Ref Range Status   Specimen Description   Final    BLOOD LEFT FOREARM Performed at [facility], [address] [place], [place] [address]    Special Requests   Final    BOTTLES DRAWN AEROBIC AND ANAEROBIC Blood Culture adequate volume Performed at [facility], [address] [place], [place] [address]  Culture   Final    NO GROWTH < 12 HOURS Performed at [facility], [address] [place], [place] [address]    Report Status PENDING  Incomplete     Labs: Basic Metabolic Panel: Recent Labs  Lab 04/07/20 0912 04/07/20 1421 04/08/20 0406  NA 135 136 137  K 4.3 3.7 4.1  CL 100  --  101  CO2 20*  --  23  GLUCOSE 126*  --  126*  BUN 18  --  25*  CREATININE 0.89  --  0.86  CALCIUM 9.8  --  9.3   Liver Function Tests: No results for input(s): AST, ALT, ALKPHOS, BILITOT, PROT, ALBUMIN in the last 168 hours. CBC: Recent Labs  Lab 04/07/20 0912 04/07/20 1421 04/08/20 0406  WBC 19.1*  --  15.3*  NEUTROABS 16.9*  --   --   HGB 17.2* 17.0 15.0  HCT 52.3* 50.0 45.2  MCV 90.3  --  91.9  PLT 293  --  245   CBG: No results for input(s): GLUCAP in the last 168 hours. Hgb A1c No results for input(s): HGBA1C in the last 72 hours. Lipid Profile No results for input(s): CHOL, HDL, LDLCALC, TRIG, CHOLHDL, LDLDIRECT in the last 72 hours. Thyroid function studies No results for input(s): TSH, T4TOTAL, T3FREE, THYROIDAB in the last 72 hours.  Invalid input(s): FREET3 Urinalysis No results found for: COLORURINE, APPEARANCEUR, LABSPEC, PHURINE, GLUCOSEU, HGBUR, BILIRUBINUR, KETONESUR, PROTEINUR, UROBILINOGEN, NITRITE, LEUKOCYTESUR  FURTHER DISCHARGE INSTRUCTIONS:   Get Medicines reviewed and adjusted: Please take all your medications with  you for your next visit with your Primary MD   Laboratory/radiological data: Please request your Primary MD to go over all hospital tests and procedure/radiological results at the follow up, please ask your Primary MD to get all Hospital records sent to his/her office.   In some cases, they will be blood work, cultures and biopsy results pending at the time of your discharge. Please request that your primary care M.D. goes through all the records of your hospital data and follows up on these results.   Also Note the following: If you experience worsening of your admission symptoms, develop shortness of breath, life threatening emergency, suicidal or homicidal thoughts you must seek medical attention immediately by calling 911 or calling your MD immediately  if symptoms less severe.   You must read complete instructions/literature along with all the possible adverse reactions/side effects for all the Medicines you take and that have been prescribed to you. Take any new Medicines after you have completely understood and accpet all the possible adverse reactions/side effects.    Do not drive when taking Pain medications or sleeping medications (Benzodaizepines)   Do not take more than prescribed Pain, Sleep and Anxiety Medications. It is not advisable to combine anxiety,sleep and pain medications without talking with your primary care practitioner   Special Instructions: If you have smoked or chewed Tobacco  in the last 2 yrs please stop smoking, stop any regular Alcohol  and or any Recreational drug use.   Wear Seat belts while driving.   Please note: You were cared for by a hospitalist during your hospital stay. Once you are discharged, your primary care physician will handle any further medical issues. Please note that NO REFILLS for any discharge medications will be authorized once you are discharged, as it is imperative that you return to your primary care physician (or establish a  relationship with a primary care physician if you do  not have one) for your post hospital discharge needs so that they can reassess your need for medications and monitor your lab values.  Time coordinating discharge: 40 minutes  SIGNED:  Pamella Pert, MD, PhD 04/08/2020, 3:54 PM

## 2020-04-08 NOTE — Care Management Obs Status (Signed)
MEDICARE OBSERVATION STATUS NOTIFICATION   Patient Details  Name: Timothy Maldonado MRN: [id number] Date of Birth: 1950-08-11   Medicare Observation Status Notification Given:  Yes (Patient did not want a copy of the notice.)    Halford Chessman 04/08/2020, 2:33 PM

## 2020-04-08 NOTE — Discharge Instructions (Signed)
Chronic Obstructive Pulmonary Disease Chronic obstructive pulmonary disease (COPD) is a long-term (chronic) lung problem. When you have COPD, it is hard for air to get in and out of your lungs. Usually the condition gets worse over time, and your lungs will never return to normal. There are things you can do to keep yourself as healthy as possible.  Your doctor may treat your condition with: ? Medicines. ? Oxygen. ? Lung surgery.  Your doctor may also recommend: ? Rehabilitation. This includes steps to make your body work better. It may involve a team of specialists. ? Quitting smoking, if you smoke. ? Exercise and changes to your diet. ? Comfort measures (palliative care). Follow these instructions at home: Medicines  Take over-the-counter and prescription medicines only as told by your doctor.  Talk to your doctor before taking any cough or allergy medicines. You may need to avoid medicines that cause your lungs to be dry. Lifestyle  If you smoke, stop. Smoking makes the problem worse. If you need help quitting, ask your doctor.  Avoid being around things that make your breathing worse. This may include smoke, chemicals, and fumes.  Stay active, but remember to rest as well.  Learn and use tips on how to relax.  Make sure you get enough sleep. Most adults need at least 7 hours of sleep every night.  Eat healthy foods. Eat smaller meals more often. Rest before meals. Controlled breathing Learn and use tips on how to control your breathing as told by your doctor. Try:  Breathing in (inhaling) through your nose for 1 second. Then, pucker your lips and breath out (exhale) through your lips for 2 seconds.  Putting one hand on your belly (abdomen). Breathe in slowly through your nose for 1 second. Your hand on your belly should move out. Pucker your lips and breathe out slowly through your lips. Your hand on your belly should move in as you breathe out.  Controlled  coughing Learn and use controlled coughing to clear mucus from your lungs. Follow these steps: 1. Lean your head a little forward. 2. Breathe in deeply. 3. Try to hold your breath for 3 seconds. 4. Keep your mouth slightly open while coughing 2 times. 5. Spit any mucus out into a tissue. 6. Rest and do the steps again 1 or 2 times as needed. General instructions  Make sure you get all the shots (vaccines) that your doctor recommends. Ask your doctor about a flu shot and a pneumonia shot.  Use oxygen therapy and pulmonary rehabilitation if told by your doctor. If you need home oxygen therapy, ask your doctor if you should buy a tool to measure your oxygen level (oximeter).  Make a COPD action plan with your doctor. This helps you to know what to do if you feel worse than usual.  Manage any other conditions you have as told by your doctor.  Avoid going outside when it is very hot, cold, or humid.  Avoid people who have a sickness you can catch (contagious).  Keep all follow-up visits as told by your doctor. This is important. Contact a doctor if:  You cough up more mucus than usual.  There is a change in the color or thickness of the mucus.  It is harder to breathe than usual.  Your breathing is faster than usual.  You have trouble sleeping.  You need to use your medicines more often than usual.  You have trouble doing your normal activities such as getting  dressed or walking around the house. Get help right away if:  You have shortness of breath while resting.  You have shortness of breath that stops you from: ? Being able to talk. ? Doing normal activities.  Your chest hurts for longer than 5 minutes.  Your skin color is more blue than usual.  Your pulse oximeter shows that you have low oxygen for longer than 5 minutes.  You have a fever.  You feel too tired to breathe normally. Summary  Chronic obstructive pulmonary disease (COPD) is a long-term lung  problem.  The way your lungs work will never return to normal. Usually the condition gets worse over time. There are things you can do to keep yourself as healthy as possible.  Take over-the-counter and prescription medicines only as told by your doctor.  If you smoke, stop. Smoking makes the problem worse. This information is not intended to replace advice given to you by your health care provider. Make sure you discuss any questions you have with your health care provider. Document Revised: 05/12/2017 Document Reviewed: 07/04/2016 Elsevier Patient Education  2020 [employer] Follow with [name], MD in 5-7 days  Please get a complete blood count and chemistry panel checked by your Primary MD at your next visit, and again as instructed by your Primary MD. Please get your medications reviewed and adjusted by your Primary MD.  Please request your Primary MD to go over all Hospital Tests and Procedure/Radiological results at the follow up, please get all Hospital records sent to your Prim MD by signing hospital release before you go home.  In some cases, there will be blood work, cultures and biopsy results pending at the time of your discharge. Please request that your primary care M.D. goes through all the records of your hospital data and follows up on these results.  If you had Pneumonia of Lung problems at the Hospital: Please get a 2 view Chest X ray done in 6-8 weeks after hospital discharge or sooner if instructed by your Primary MD.  If you have Congestive Heart Failure: Please call your Cardiologist or Primary MD anytime you have any of the following symptoms:  1) 3 pound weight gain in 24 hours or 5 pounds in 1 week  2) shortness of breath, with or without a dry hacking cough  3) swelling in the hands, feet or stomach  4) if you have to sleep on extra pillows at night in order to breathe  Follow cardiac low salt diet and 1.5 lit/day fluid restriction.  If you have  diabetes Accuchecks 4 times/day, Once in AM empty stomach and then before each meal. Log in all results and show them to your primary doctor at your next visit. If any glucose reading is under 80 or above 300 call your primary MD immediately.  If you have Seizure/Convulsions/Epilepsy: Please do not drive, operate heavy machinery, participate in activities at heights or participate in high speed sports until you have seen by Primary MD or a Neurologist and advised to do so again. Per [facility] statutes, patients with seizures are not allowed to drive until they have been seizure-free for six months.  Use caution when using heavy equipment or power tools. Avoid working on ladders or at heights. Take showers instead of baths. Ensure the water temperature is not too high on the home water heater. Do not go swimming alone. Do not lock yourself in a room alone (i.e. bathroom). When caring for infants or  small children, sit down when holding, feeding, or changing them to minimize risk of injury to the child in the event you have a seizure. Maintain good sleep hygiene. Avoid alcohol.   If you had Gastrointestinal Bleeding: Please ask your Primary MD to check a complete blood count within one week of discharge or at your next visit. Your endoscopic/colonoscopic biopsies that are pending at the time of discharge, will also need to followed by your Primary MD.  Get Medicines reviewed and adjusted. Please take all your medications with you for your next visit with your Primary MD  Please request your Primary MD to go over all hospital tests and procedure/radiological results at the follow up, please ask your Primary MD to get all Hospital records sent to his/her office.  If you experience worsening of your admission symptoms, develop shortness of breath, life threatening emergency, suicidal or homicidal thoughts you must seek medical attention immediately by calling 911 or calling your MD immediately   if symptoms less severe.  You must read complete instructions/literature along with all the possible adverse reactions/side effects for all the Medicines you take and that have been prescribed to you. Take any new Medicines after you have completely understood and accpet all the possible adverse reactions/side effects.   Do not drive or operate heavy machinery when taking Pain medications.   Do not take more than prescribed Pain, Sleep and Anxiety Medications  Special Instructions: If you have smoked or chewed Tobacco  in the last 2 yrs please stop smoking, stop any regular Alcohol  and or any Recreational drug use.  Wear Seat belts while driving.  Please note You were cared for by a hospitalist during your hospital stay. If you have any questions about your discharge medications or the care you received while you were in the hospital after you are discharged, you can call the unit and asked to speak with the hospitalist on call if the hospitalist that took care of you is not available. Once you are discharged, your primary care physician will handle any further medical issues. Please note that NO REFILLS for any discharge medications will be authorized once you are discharged, as it is imperative that you return to your primary care physician (or establish a relationship with a primary care physician if you do not have one) for your aftercare needs so that they can reassess your need for medications and monitor your lab values.  You can reach the hospitalist office at phone [phone] or fax [phone]   If you do not have a primary care physician, you can call [phone] for a physician referral.  Activity: As tolerated with Full fall precautions use walker/cane & assistance as needed    Diet: regular  Disposition Home

## 2020-04-08 NOTE — Care Management Obs Status (Deleted)
MEDICARE OBSERVATION STATUS NOTIFICATION   Patient Details  Name: Quantarius Genrich MRN: [id number] Date of Birth: Nov 17, 1950   Medicare Observation Status Notification Given:  Yes (Patient did not want a copy of the notice.)    Darleene Cleaver, LCSW 04/08/2020, 2:32 PM

## 2020-04-08 NOTE — Care Management CC44 (Signed)
Condition Code 44 Documentation Completed  Patient Details  Name: Timothy Maldonado MRN: [id number] Date of Birth: 04-25-1951   Condition Code 44 given:  Yes Patient signature on Condition Code 44 notice:  Yes Documentation of 2 MD's agreement:  Yes Code 44 added to claim:  Yes    Darleene Cleaver, LCSW 04/08/2020, 2:31 PM

## 2020-04-08 NOTE — Progress Notes (Signed)
Pt. seen on rounds for status@ this time, BiPAP removed ~2315, RN notified RT pt. declined scheduled aerosol tx. ~2315, resting comfortably in bed in minimal distress, able to speak full sentences, AOX3 and currently on 3 lpm n/c with 97% sats, no oxygen use at home, RR of 28-32, diminished b/l b.s. with distant Rhonchi noted-placed humidity btl. on with oxygen, has strong non-cough, RN @ bedside made aware ,pt. agreed to ~0200 Duoneb tx, made aware to notify if prn tx. needed prior to then, RT to monitor.

## 2020-04-08 NOTE — Care Management CC44 (Deleted)
Condition Code 44 Documentation Completed  Patient Details  Name: Kayshaun Polanco MRN: [id number] Date of Birth: July 03, 1950   Condition Code 44 given:  Yes Patient signature on Condition Code 44 notice:  Yes Documentation of 2 MD's agreement:  Yes Code 44 added to claim:  Yes    Darleene Cleaver, LCSW 04/08/2020, 2:32 PM

## 2020-04-10 DIAGNOSIS — J449 Chronic obstructive pulmonary disease, unspecified: Secondary | ICD-10-CM | POA: Diagnosis not present

## 2020-04-10 LAB — COLOGUARD: COLOGUARD: NEGATIVE

## 2020-04-12 LAB — CULTURE, BLOOD (ROUTINE X 2)
Culture: NO GROWTH
Culture: NO GROWTH
Special Requests: ADEQUATE
Special Requests: ADEQUATE

## 2020-04-21 ENCOUNTER — Other Ambulatory Visit: Payer: Self-pay

## 2020-04-21 ENCOUNTER — Encounter: Payer: Self-pay | Admitting: Internal Medicine

## 2020-04-21 ENCOUNTER — Ambulatory Visit: Payer: Medicare HMO | Admitting: Internal Medicine

## 2020-04-21 DIAGNOSIS — J449 Chronic obstructive pulmonary disease, unspecified: Secondary | ICD-10-CM

## 2020-04-21 DIAGNOSIS — R69 Illness, unspecified: Secondary | ICD-10-CM | POA: Diagnosis not present

## 2020-04-21 DIAGNOSIS — R0600 Dyspnea, unspecified: Secondary | ICD-10-CM

## 2020-04-21 DIAGNOSIS — J9601 Acute respiratory failure with hypoxia: Secondary | ICD-10-CM

## 2020-04-21 DIAGNOSIS — J9602 Acute respiratory failure with hypercapnia: Secondary | ICD-10-CM

## 2020-04-21 DIAGNOSIS — R0609 Other forms of dyspnea: Secondary | ICD-10-CM | POA: Insufficient documentation

## 2020-04-21 LAB — CBC WITH DIFFERENTIAL/PLATELET
Basophils Absolute: 0.1 10*3/uL (ref 0.0–0.1)
Basophils Relative: 0.4 % (ref 0.0–3.0)
Eosinophils Absolute: 0.2 10*3/uL (ref 0.0–0.7)
Eosinophils Relative: 1.5 % (ref 0.0–5.0)
HCT: 51.9 % (ref 39.0–52.0)
Hemoglobin: 16.8 g/dL (ref 13.0–17.0)
Lymphocytes Relative: 19.4 % (ref 12.0–46.0)
Lymphs Abs: 3 10*3/uL (ref 0.7–4.0)
MCHC: 32.4 g/dL (ref 30.0–36.0)
MCV: 91.4 fl (ref 78.0–100.0)
Monocytes Absolute: 1.7 10*3/uL — ABNORMAL HIGH (ref 0.1–1.0)
Monocytes Relative: 10.8 % (ref 3.0–12.0)
Neutro Abs: 10.4 10*3/uL — ABNORMAL HIGH (ref 1.4–7.7)
Neutrophils Relative %: 67.9 % (ref 43.0–77.0)
Platelets: 323 10*3/uL (ref 150.0–400.0)
RBC: 5.69 Mil/uL (ref 4.22–5.81)
RDW: 14.9 % (ref 11.5–15.5)
WBC: 15.3 10*3/uL — ABNORMAL HIGH (ref 4.0–10.5)

## 2020-04-21 LAB — BASIC METABOLIC PANEL
BUN: 18 mg/dL (ref 6–23)
CO2: 30 mEq/L (ref 19–32)
Calcium: 10 mg/dL (ref 8.4–10.5)
Chloride: 96 mEq/L (ref 96–112)
Creatinine, Ser: 1.05 mg/dL (ref 0.40–1.50)
GFR: 72.89 mL/min (ref >60.00)
Glucose, Bld: 72 mg/dL (ref 70–99)
Potassium: 5 mEq/L (ref 3.5–5.1)
Sodium: 136 mEq/L (ref 135–145)

## 2020-04-21 LAB — TSH: TSH: 3.83 u[IU]/mL (ref 0.35–4.50)

## 2020-04-21 MED ORDER — ALBUTEROL SULFATE (2.5 MG/3ML) 0.083% IN NEBU: 2.5000 mg | INHALATION_SOLUTION | RESPIRATORY_TRACT | 12 refills | Status: DC | PRN

## 2020-04-21 NOTE — Assessment & Plan Note (Signed)
See admit 04/07/20  -  04/21/2020   Walked RA  2 laps @ approx 251ft each @ avg pace  stopped due to end of study,  Sob on last lap but talked incessantly during walk and sats still 94% at end   Appears to have resolved,  Advised: Make sure you check your oxygen saturations at highest level of activity to be sure it stays over 90% and keep track of it at least once a week, more often if breathing getting worse, and let me know if losing ground or wear 02/pace better  With goal of keeping > 90% with activity          Each maintenance medication was reviewed in detail including emphasizing most importantly the difference between maintenance and prns and under what circumstances the prns are to be triggered using an action plan format where appropriate.  Total time for H and P, chart review, counseling,  directly observing portions of ambulatory 02 saturation study/  and generating customized AVS unique to this office visit / charting = 

## 2020-04-21 NOTE — Progress Notes (Signed)
Subjective:    Patient ID: Timothy Maldonado, male    DOB: [birth year],    MRN: [id number]    Brief patient profile:  68 yowm  Quit smoking 04/07/20  with new onset doe 2014 referred by Dr Timothy Maldonado to pulmonary clinic 01/26/2015 with document GOLD III COPD on 01/12/15    History of Present Illness  01/26/2015 1st [facility] office visit/ Timothy Maldonado   Chief Complaint  Patient presents with  . Advice Only    Referred by Dr. Renne Maldonado for SOB, DOE, weight loss  better since starting spiriva - only sob with exertion/ ok if paces himself slowly = [facility] 2  rec Plan A = Automatic = spiriva 2 puffs each am Plan B= Backup - Only use your albuterol (proair - RED) as a rescue medication     Admit date: 04/07/2020 Discharge date: 04/08/2020 Home Health: None Equipment/Devices: Home oxygen, home nebulizer machine    HPI: Per admitting MD, Timothy Maldonado a 69 y.o.malewith medical history significant ofCOPD-emphysema by CT chest and PFTs, hyperlipidemia, hypertension, atherosclerosis, andThoracic AAwho presents emergency department with shortness of breath. At his baseline he gets SOB with exertion. For the past week he has been much worse unable to complete household tasks without resting.States that this morning he awoke and began coughing. He states that he could not stop and then had severe difficulty breathing. He was unable to get his oxygen saturations above 90% at home and presented to the emergency department. He is a chronic daily smokersmoking 10-20 cig/day. He is on albuterol and Anoro Ellipta. Patient states that his breathing seemed a bit worse yesterday and that at times he felt "like I was mechanically breathing and had to think about each breath in and out." He has been triple vaccinated against the coronavirus and also had his flu vaccine earlier this year. He denies any fevers, chills, chest pain, history of CHF or volume overload. He states that he has never been  hospitalized or even to the emergency department for his breathing before.  Hospital Course / Discharge diagnoses: COPD/emphysema -with acute exacerbation, patient wheezing on admission.  He was placed on nebulizers, steroids, he was initially requiring BiPAP but improved rapidly with treatment.  By hospital day 2, his wheezing has completely resolved, he was satting well on room air and able to ambulate and feeling back to baseline.  He does however require oxygen on discharge as he desats into the mid 80s on room air with walking.  Patient tells me this is chronic, he has checked his O2 sats at home with ambulation for a while now and he is regularly seeing sats in the low 80s.  He will be discharged on a prednisone taper, was not started on antibiotics due to not having a productive cough and chest x-ray was clear on admission.  He will be set up with home oxygen as well as a nebulizer machine at home. Essential hypertension-resume home medications on discharge Hyperlipidemia-resume home medications on discharge Tobacco use -counseled to quit     04/21/2020   ov/Timothy Maldonado re: re-establish s/p admit  maint on anoro  Chief Complaint  Patient presents with  . Pulmonary Consult    Referred by Dr. Merri Maldonado. Recent hospitalization 04/07/20-04/08/20 for COPD exacerbation- sent home on o2 and nebs. He has not been using the o2 consistantly.   Dyspnea: room to room now /  Baseline and back to house 50-75 ft and flat and stops on landing  Cough: better > just clear  mucus  Sleeping: on side/ flat bed  SABA use: once daily around lunch duoneb  02: not using - 02 is low 90s  Last prednisone 04/20/20    No obvious day to day or daytime variability or assoc excess/ purulent sputum or mucus plugs or hemoptysis or cp or chest tightness, subjective wheeze or overt sinus or hb symptoms.   Sleeping  without nocturnal  or early am exacerbation  of respiratory  c/o's or need for noct saba. Also denies any  obvious fluctuation of symptoms with weather or environmental changes or other aggravating or alleviating factors except as outlined above   No unusual exposure hx or h/o childhood pna/ asthma or knowledge of premature birth.  Current Allergies, Complete Past Medical History, Past Surgical History, Family History, and Social History were reviewed in [employer] record.  ROS  The following are not active complaints unless bolded Hoarseness, sore throat, dysphagia, dental problems, itching, sneezing,  nasal congestion or discharge of excess mucus or purulent secretions, ear ache,   fever, chills, sweats, unintended wt loss or wt gain, classically pleuritic or exertional cp,  orthopnea pnd or arm/hand swelling  or leg swelling, presyncope, palpitations, abdominal pain, anorexia, nausea, vomiting, diarrhea  or change in bowel habits or change in bladder habits, change in stools or change in urine, dysuria, hematuria,  rash, arthralgias, visual complaints, headache, numbness, weakness or ataxia or problems with walking or coordination,  change in mood or  memory.        Current Meds  Medication Sig  . ALPRAZolam (XANAX) 0.25 MG tablet Take 1 tablet (0.25 mg total) by mouth 2 (two) times daily as needed for anxiety.  [name] ELLIPTA 62.5-25 MCG/INH AEPB Inhale 1 puff into the lungs daily.  Marland Kitchen aspirin 81 MG tablet Take 81 mg by mouth daily.  . CRESTOR 20 MG tablet Take 20 mg by mouth daily.   Marland Kitchen ezetimibe (ZETIA) 10 MG tablet Take 10 mg by mouth daily.  . hydrochlorothiazide (MICROZIDE) 12.5 MG capsule Take 12.5 mg by mouth daily.  Marland Kitchen ipratropium-albuterol (DUONEB) 0.5-2.5 (3) MG/3ML SOLN Take 3 mLs by nebulization every 12 (twelve) hours as needed (wheezing, shortness of breath).  . irbesartan (AVAPRO) 150 MG tablet Take 150 mg by mouth daily after supper.              .      Objective:   Physical Exam  Thin hoarse very anxious to point of being hyperkinetic  amb wm mild  rattling cough    04/21/2020       141   01/26/15 135 lb 3.2 oz (61.326 kg)  12/22/08 167 lb 9.6 oz (76.023 kg)  08/11/08 178 lb 6.4 oz (80.922 kg)     Vital signs reviewed  04/21/2020  - Note at rest 02 sats  97% on RA  HEENT : pt wearing mask not removed for exam due to covid -19 concerns.    NECK :  without JVD/Nodes/TM/ nl carotid upstrokes bilaterally   LUNGS: no acc muscle use,  Mod barrel  contour chest wall with bilateral  Distant bs s audible wheeze and  without cough on insp or exp maneuvers and mod  Hyperresonant  to  percussion bilaterally     CV:  RRR  no s3 or murmur or increase in P2, and no edema   ABD:  soft and nontender with pos mid insp Hoover's  in the supine position. No bruits or organomegaly appreciated, bowel sounds nl  MS:     ext warm without deformities, calf tenderness, cyanosis or clubbing No obvious joint restrictions   SKIN: warm and dry without lesions    NEURO:  alert, approp, nl sensorium with  no motor or cerebellar deficits apparent.         I personally reviewed images and agree with radiology impression as follows:  CXR:    04/07/20 Lungs hyperexpanded. No edema or airspace opacity. Heart size normal. Emphysema (ICD10-J43.9).      Labs ordered 04/21/2020  :      alpha one AT phenotype         Assessment & Plan:

## 2020-04-21 NOTE — Assessment & Plan Note (Signed)
Quit smoking 10/201  - PFT's  01/22/15 FEV1 1.17 (34 % ) ratio 0.35    p 0 prior to study with DLCO  16.89 (54%) corrects to 2.89 (63%)  for alv volume and FV curve classic concavity   -  04/21/2020:   alpha one AT phenotype  >>>  DDX of  difficult airways management almost all start with A and  include Adherence, Ace Inhibitors, Acid Reflux, Active Sinus Disease, Alpha 1 Antitripsin deficiency, Anxiety masquerading as Airways dz,  ABPA,  Allergy(esp in young), Aspiration (esp in elderly), Adverse effects of meds,  Active smoking or vaping, A bunch of PE's (a small clot burden can't cause this syndrome unless there is already severe underlying pulm or vascular dz with poor reserve) plus two Bs  = Bronchiectasis and Beta blocker use..and one C= CHF  Adherence is always the initial "prime suspect" and is a multilayered concern that requires a "trust but verify" approach in every patient - starting with knowing how to use medications, especially inhalers, correctly, keeping up with refills and understanding the fundamental difference between maintenance and prns vs those medications only taken for a very short course and then stopped and not refilled.  - trained on approp use of elipta and neb  Active smoking > says quit for good   ?allergy/asthma component > doubt it but if starts to flare off pred rec change to trelegy  ? Anxiety > usually at the bottom of this list of usual suspects but should be much higher on this pt's based on H and P and note already rec for   psychotropics and may interfere with adherence and also interpretation of response or lack thereof to symptom management which can be quite subjective  rec rx xanax 0.25 mg prn and f/u PCP  ? Anemia/ thyroid disorder > check labs   ? Alpha one AT def > check labs   ? Adverse drug effects > none listed   ? chf > no evidence by recent cxr/ bnp

## 2020-04-21 NOTE — Patient Instructions (Addendum)
Plan A = Automatic = Always=    Anoro one click each am   Plan B = Backup (to supplement plan A, not to replace it) Only use your albuterol (prefer without ipatropium) nebulizer as a rescue medication to be used if you can't catch your breath by resting or doing a relaxed purse lip breathing pattern.  - The less you use it, the better it will work when you need it. - Ok to use the inhaler up to  every 4 hours if you must but call for appointment if use goes up over your usual need  Make sure you check your oxygen saturations at highest level of activity to be sure it stays over 90% and adjust  02 flow upward to maintain this level if needed but remember to turn it back to previous settings when you stop (to conserve your supply).   If can't comfortable with cramps or breathing ok to try xanax 0.25 mg twice daily as needed   Please remember to go to the lab department   for your tests - we will call you with the results when they are available.  Please schedule a follow up office visit in 4 weeks, sooner if needed

## 2020-04-22 ENCOUNTER — Telehealth: Payer: Self-pay

## 2020-04-22 NOTE — Telephone Encounter (Signed)
Pt was informed results of basic metabolic panel was informed nothing  significant bloodwork  In and verbalized understanding.nothing further needed.

## 2020-05-09 DIAGNOSIS — J449 Chronic obstructive pulmonary disease, unspecified: Secondary | ICD-10-CM | POA: Diagnosis not present

## 2020-05-11 DIAGNOSIS — J449 Chronic obstructive pulmonary disease, unspecified: Secondary | ICD-10-CM | POA: Diagnosis not present

## 2020-05-12 ENCOUNTER — Other Ambulatory Visit: Payer: Self-pay

## 2020-05-12 ENCOUNTER — Ambulatory Visit (INDEPENDENT_AMBULATORY_CARE_PROVIDER_SITE_OTHER)
Admission: RE | Admit: 2020-05-12 | Discharge: 2020-05-12 | Disposition: A | Discharge status: Home / Self Care | Payer: Medicare HMO | Source: Ambulatory Visit

## 2020-05-12 DIAGNOSIS — Z87891 Personal history of nicotine dependence: Secondary | ICD-10-CM

## 2020-05-12 DIAGNOSIS — R69 Illness, unspecified: Secondary | ICD-10-CM | POA: Diagnosis not present

## 2020-05-12 DIAGNOSIS — F17210 Nicotine dependence, cigarettes, uncomplicated: Secondary | ICD-10-CM

## 2020-05-12 DIAGNOSIS — Z122 Encounter for screening for malignant neoplasm of respiratory organs: Secondary | ICD-10-CM

## 2020-05-18 ENCOUNTER — Other Ambulatory Visit: Payer: Self-pay | Admitting: *Deleted

## 2020-05-18 DIAGNOSIS — Z87891 Personal history of nicotine dependence: Secondary | ICD-10-CM

## 2020-05-18 NOTE — Progress Notes (Signed)
Please call patient and let them  know their  low dose Ct was read as a Lung RADS 2: nodules that are benign in appearance and behavior with a very low likelihood of becoming a clinically active cancer due to size or lack of growth. Recommendation per radiology is for a repeat LDCT in 12 months. .Please let them  know we will order and schedule their  annual screening scan for 04/2021. Please let them  know there was notation of CAD on their  scan.  Please remind the patient  that this is a non-gated exam therefore degree or severity of disease  cannot be determined. Please have them  follow up with their PCP regarding potential risk factor modification, dietary therapy or pharmacologic therapy if clinically indicated. Pt.  is  currently on statin therapy. Please place order for annual  screening scan for  04/2021 and fax results to PCP. Thanks so much.  This patient is followed by cards

## 2020-05-21 LAB — ALPHA-1 ANTITRYPSIN PHENOTYPE: A-1 Antitrypsin, Ser: 116 mg/dL (ref 83–199)

## 2020-05-22 ENCOUNTER — Ambulatory Visit: Payer: Medicare HMO | Admitting: Internal Medicine

## 2020-05-22 ENCOUNTER — Encounter: Payer: Self-pay | Admitting: Internal Medicine

## 2020-05-22 ENCOUNTER — Other Ambulatory Visit: Payer: Self-pay

## 2020-05-22 DIAGNOSIS — J449 Chronic obstructive pulmonary disease, unspecified: Secondary | ICD-10-CM | POA: Diagnosis not present

## 2020-05-22 MED ORDER — ALBUTEROL SULFATE HFA 108 (90 BASE) MCG/ACT IN AERS: 2.0000 | INHALATION_SPRAY | RESPIRATORY_TRACT | 11 refills | Status: DC | PRN

## 2020-05-22 NOTE — Patient Instructions (Addendum)
We will walk you today to check your need for portable 02   Make sure you check your oxygen saturations at highest level of activity to be sure it stays over 90% and adjust  02 flow upward to maintain this level if needed but remember to turn it back to previous settings when you stop (to conserve your supply).   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      Please schedule a follow up visit in 6 months but call sooner if needed

## 2020-05-22 NOTE — Progress Notes (Signed)
Subjective:    Patient ID: Timothy Maldonado, male    DOB: 07-26-1950,    MRN: [id number]    Brief patient profile:  35 yowm  MZ/ Quit smoking 04/07/20  with new onset doe 2014 referred by Dr Timothy Maldonado to pulmonary clinic 01/26/2015 with document GOLD III COPD on 01/12/15    History of Present Illness  01/26/2015 1st [facility] office visit/ Timothy Maldonado   Chief Complaint  Patient presents with  . Advice Only    Referred by Dr. Renne Maldonado for SOB, DOE, weight loss  better since starting spiriva - only sob with exertion/ ok if paces himself slowly = MMRC 2  rec Plan A = Automatic = spiriva 2 puffs each am Plan B= Backup - Only use your albuterol (proair - RED) Timothy a rescue medication     Admit date: 04/07/2020 Discharge date: 04/08/2020 Home Health: None Equipment/Devices: Home oxygen, home nebulizer machine    HPI: Per admitting MD, Timothy Maldonado a 69 y.o.malewith medical history significant ofCOPD-emphysema by CT chest and PFTs, hyperlipidemia, hypertension, atherosclerosis, andThoracic AAwho presents emergency department with shortness of breath. At his baseline he gets SOB with exertion. For the past week he has been much worse unable to complete household tasks without resting.States that this morning he awoke and began coughing. He states that he could not stop and then had severe difficulty breathing. He was unable to get his oxygen saturations above 90% at home and presented to the emergency department. He is a chronic daily smokersmoking 10-20 cig/day. He is on albuterol and Anoro Ellipta. Patient states that his breathing seemed a bit worse yesterday and that at times he felt "like I was mechanically breathing and had to think about each breath in and out." He has been triple vaccinated against the coronavirus and also had his flu vaccine earlier this year. He denies any fevers, chills, chest pain, history of CHF or volume overload. He states that he has never been  hospitalized or even to the emergency department for his breathing before.  Hospital Course / Discharge diagnoses: COPD/emphysema -with acute exacerbation, patient wheezing on admission.  He was placed on nebulizers, steroids, he was initially requiring BiPAP but improved rapidly with treatment.  By hospital day 2, his wheezing has completely resolved, he was satting well on room air and able to ambulate and feeling back to baseline.  He does however require oxygen on discharge Timothy he desats into the mid 80s on room air with walking.  Patient tells me this is chronic, he has checked his O2 sats at home with ambulation for a while now and he is regularly seeing sats in the low 80s.  He will be discharged on a prednisone taper, was not started on antibiotics due to not having a productive cough and chest x-ray was clear on admission.  He will be set up with home oxygen Timothy well Timothy a nebulizer machine at home. Essential hypertension-resume home medications on discharge Hyperlipidemia-resume home medications on discharge Tobacco use -counseled to quit     04/21/2020   ov/Timothy Maldonado re: re-establish s/p admit  maint on anoro  Chief Complaint  Patient presents with  . Pulmonary Consult    Referred by Dr. Merri Maldonado. Recent hospitalization 04/07/20-04/08/20 for COPD exacerbation- sent home on o2 and nebs. He has not been using the o2 consistantly.   Dyspnea: room to room now /  Baseline and back to house 50-75 ft and flat and stops on landing  Cough: better > just  clear mucus  Sleeping: on side/ flat bed  SABA use: once daily around lunch duoneb  02: not using - 02 is low 90s  Last prednisone 04/20/20  rec Plan A = Automatic = Always=    Anoro one click each am  Plan B = Backup (to supplement plan A, not to replace it) Only use your albuterol (prefer without ipatropium) nebulizer Timothy a rescue medication  Make sure you check your oxygen saturations at highest level of activity  If can't comfortable with  cramps or breathing ok to try xanax 0.25 mg twice daily Timothy needed      05/22/2020  f/u ov/Asmara Maldonado re: MZ/ copd  Anoro  Chief Complaint  Patient presents with  . Follow-up    Breathing has improved slightly since the last visit. He does not have an albuterol inhaler right now and is using his albuterol neb on average once per day.   Dyspnea:  Mailbox is 75 ft flat / steps are more difficult  Cough: none  Sleeping: ok flat  SABA use: just using  02: prn 02 but does not check sats    No obvious day to day or daytime variability or assoc excess/ purulent sputum or mucus plugs or hemoptysis or cp or chest tightness, subjective wheeze or overt sinus or hb symptoms.   Sleeping  without nocturnal  or early am exacerbation  of respiratory  c/o's or need for noct saba. Also denies any obvious fluctuation of symptoms with weather or environmental changes or other aggravating or alleviating factors except Timothy outlined above   No unusual exposure hx or h/o childhood pna/ asthma or knowledge of premature birth.  Current Allergies, Complete Past Medical History, Past Surgical History, Family History, and Social History were reviewed in [employer] record.  ROS  The following are not active complaints unless bolded Hoarseness, sore throat, dysphagia, dental problems, itching, sneezing,  nasal congestion or discharge of excess mucus or purulent secretions, ear ache,   fever, chills, sweats, unintended wt loss or wt gain, classically pleuritic or exertional cp,  orthopnea pnd or arm/hand swelling  or leg swelling, presyncope, palpitations, abdominal pain, anorexia, nausea, vomiting, diarrhea  or change in bowel habits or change in bladder habits, change in stools or change in urine, dysuria, hematuria,  rash, arthralgias, visual complaints, headache, numbness, weakness or ataxia or problems with walking or coordination,  change in mood or  memory.        Current Meds  Medication Sig   . albuterol (PROVENTIL) (2.5 MG/3ML) 0.083% nebulizer solution Take 3 mLs (2.5 mg total) by nebulization every 4 (four) hours Timothy needed for wheezing or shortness of breath.  Marland Kitchen albuterol (VENTOLIN HFA) 108 (90 Base) MCG/ACT inhaler Inhale 2 puffs into the lungs every 6 (six) hours Timothy needed for wheezing or shortness of breath.  . ALPRAZolam (XANAX) 0.25 MG tablet Take 1 tablet (0.25 mg total) by mouth 2 (two) times daily Timothy needed for anxiety.  [name] ELLIPTA 62.5-25 MCG/INH AEPB Inhale 1 puff into the lungs daily.  Marland Kitchen aspirin 81 MG tablet Take 81 mg by mouth daily.  . CRESTOR 20 MG tablet Take 20 mg by mouth daily.   Marland Kitchen ezetimibe (ZETIA) 10 MG tablet Take 10 mg by mouth daily.  . hydrochlorothiazide (MICROZIDE) 12.5 MG capsule Take 12.5 mg by mouth daily.  . irbesartan (AVAPRO) 150 MG tablet Take 150 mg by mouth daily after supper.        Marland Kitchen  Objective:   Physical Exam  Thin amb pleasant wm nad    05/22/2020      145  04/21/2020       141   01/26/15 135 lb 3.2 oz (61.326 kg)  12/22/08 167 lb 9.6 oz (76.023 kg)  08/11/08 178 lb 6.4 oz (80.922 kg)     Vital signs reviewed  05/22/2020  - Note at rest 02 sats  92% on RA     HEENT : pt wearing mask not removed for exam due to covid -19 concerns.    NECK :  without JVD/Nodes/TM/ nl carotid upstrokes bilaterally   LUNGS: no acc muscle use,  Mod barrel  contour chest wall with bilateral  Distant bs s audible wheeze and  without cough on insp or exp maneuvers and mod  Hyperresonant  to  percussion bilaterally     CV:  RRR  no s3 or murmur or increase in P2, and no edema   ABD:  soft and nontender with pos mid insp Hoover's  in the supine position. No bruits or organomegaly appreciated, bowel sounds nl  MS:     ext warm without deformities, calf tenderness, cyanosis or clubbing No obvious joint restrictions   SKIN: warm and dry without lesions    NEURO:  alert, approp, nl sensorium with  no motor or cerebellar deficits  apparent.           I personally reviewed images and agree with radiology impression Timothy follows:   Chest LDSC  05/12/20 1. Lung-RADS 2, benign appearance or behavior. 2. Emphysema and aortic atherosclerosis. 3. Coronary artery calcifications.     Assessment & Plan:

## 2020-05-22 NOTE — Progress Notes (Signed)
Pt present for ov 05/22/20 with Dr Sherene Sires and per Dr Sherene Sires will discuss with the pt during his visit today.

## 2020-05-22 NOTE — Assessment & Plan Note (Addendum)
Quit smoking 03/2020 / MZ  - PFT's  01/22/15 FEV1 1.17 (34 % ) ratio 0.35    p 0 prior to study with DLCO  16.89 (54%) corrects to 2.89 (63%)  for alv volume and FV curve classic concavity   -  04/21/2020:   alpha one AT phenotype  >>>  MZ   Level 116  -  05/22/2020   Walked RA  2 laps @ approx 231ft each @ avg pace  stopped due to end of study,  Min sob with sats 92%  Reviewed MZ status, does not appear to be alpha one Def so no rx needed  Now that pt is completely off cigs appears to be  Group B in terms of symptom/risk and laba/lama therefore appropriate rx at this point >>>  Continue anoro and appropriate saba   Re saba I spent extra time with pt today reviewing appropriate use of albuterol for prn use on exertion with the following points: 1) saba is for relief of sob that does not improve by walking a slower pace or resting but rather if the pt does not improve after trying this first. 2) If the pt is convinced, as many are, that saba helps recover from activity faster then it's easy to tell if this is the case by re-challenging : ie stop, take the inhaler, then p 5 minutes try the exact same activity (intensity of workload) that just caused the symptoms and see if they are substantially diminished or not after saba 3) if there is an activity that reproducibly causes the symptoms, try the saba 15 min before the activity on alternate days   If in fact the saba really does help, then fine to continue to use it prn but advised may need to look closer at the maintenance regimen being used to achieve better control of airways disease with exertion.   Also advised:  Make sure you check your oxygen saturations at highest level of activity to be sure it stays over 90% and keep track of it at least once a week, more often if breathing getting worse, and let me know if losing ground.    >>>> f/u q 6 m call sooner if needed          Each maintenance medication was reviewed in detail including  emphasizing most importantly the difference between maintenance and prns and under what circumstances the prns are to be triggered using an action plan format where appropriate.  Total time for H and P, chart review, counseling,  directly observing portions of ambulatory 02 saturation study/  and generating customized AVS unique to this office visit / charting > 30 min

## 2020-06-08 DIAGNOSIS — J449 Chronic obstructive pulmonary disease, unspecified: Secondary | ICD-10-CM | POA: Diagnosis not present

## 2020-06-10 DIAGNOSIS — J449 Chronic obstructive pulmonary disease, unspecified: Secondary | ICD-10-CM | POA: Diagnosis not present

## 2020-07-09 DIAGNOSIS — J449 Chronic obstructive pulmonary disease, unspecified: Secondary | ICD-10-CM | POA: Diagnosis not present

## 2020-07-11 DIAGNOSIS — J449 Chronic obstructive pulmonary disease, unspecified: Secondary | ICD-10-CM | POA: Diagnosis not present

## 2020-08-09 DIAGNOSIS — J449 Chronic obstructive pulmonary disease, unspecified: Secondary | ICD-10-CM | POA: Diagnosis not present

## 2020-08-10 DIAGNOSIS — J449 Chronic obstructive pulmonary disease, unspecified: Secondary | ICD-10-CM | POA: Diagnosis not present

## 2020-09-06 DIAGNOSIS — J449 Chronic obstructive pulmonary disease, unspecified: Secondary | ICD-10-CM | POA: Diagnosis not present

## 2020-09-08 DIAGNOSIS — J449 Chronic obstructive pulmonary disease, unspecified: Secondary | ICD-10-CM | POA: Diagnosis not present

## 2020-10-09 DIAGNOSIS — J449 Chronic obstructive pulmonary disease, unspecified: Secondary | ICD-10-CM | POA: Diagnosis not present

## 2020-11-08 DIAGNOSIS — J449 Chronic obstructive pulmonary disease, unspecified: Secondary | ICD-10-CM | POA: Diagnosis not present

## 2020-11-19 ENCOUNTER — Ambulatory Visit: Payer: Medicare HMO | Admitting: Internal Medicine

## 2020-11-19 ENCOUNTER — Encounter: Payer: Self-pay | Admitting: Internal Medicine

## 2020-11-19 ENCOUNTER — Other Ambulatory Visit: Payer: Self-pay

## 2020-11-19 DIAGNOSIS — R69 Illness, unspecified: Secondary | ICD-10-CM | POA: Diagnosis not present

## 2020-11-19 DIAGNOSIS — J449 Chronic obstructive pulmonary disease, unspecified: Secondary | ICD-10-CM | POA: Diagnosis not present

## 2020-11-19 DIAGNOSIS — J9600 Acute respiratory failure, unspecified whether with hypoxia or hypercapnia: Secondary | ICD-10-CM

## 2020-11-19 DIAGNOSIS — F17210 Nicotine dependence, cigarettes, uncomplicated: Secondary | ICD-10-CM

## 2020-11-19 NOTE — Assessment & Plan Note (Signed)
Quit smoking 03/2020 / MZ  - PFT's  01/22/15 FEV1 1.17 (34 % ) ratio 0.35    p 0 prior to study with DLCO  16.89 (54%) corrects to 2.89 (63%)  for alv volume and FV curve classic concavity   -  04/21/2020:   alpha one AT phenotype  >>>  MZ   Level 116  -  05/22/2020   Walked RA  2 laps @ approx 260ft each @ avg pace  stopped due to end of study,  Min sob with sats 92%  Pt is Group B in terms of symptom/risk and laba/lama therefore appropriate rx at this point >>>  anoro and prn saba  Re saba: I spent extra time with pt today reviewing appropriate use of albuterol for prn use on exertion with the following points: 1) saba is for relief of sob that does not improve by walking a slower pace or resting but rather if the pt does not improve after trying this first. 2) If the pt is convinced, as many are, that saba helps recover from activity faster then it's easy to tell if this is the case by re-challenging : ie stop, take the inhaler, then p 5 minutes try the exact same activity (intensity of workload) that just caused the symptoms and see if they are substantially diminished or not after saba 3) if there is an activity that reproducibly causes the symptoms, try the saba 15 min before the activity on alternate days   If in fact the saba really does help, then fine to continue to use it prn but advised may need to look closer at the maintenance regimen being used to achieve better control of airways disease with exertion.

## 2020-11-19 NOTE — Assessment & Plan Note (Addendum)
Counseled re importance of smoking cessation but did not meet time criteria for separate billing            Each maintenance medication was reviewed in detail including emphasizing most importantly the difference between maintenance and prns and under what circumstances the prns are to be triggered using an action plan format where appropriate.  Total time for H and P, chart review, counseling, reviewing elipta/hfa/neb/02  device(s) and generating customized AVS unique to this office visit / same day charting = 25 min

## 2020-11-19 NOTE — Assessment & Plan Note (Signed)
See admit 04/07/20 - now more of a chronic ex related hypoxemia  -  04/21/2020   Walked RA  2 laps @ approx 236ft each @ avg pace  stopped due to end of study,  Sob on last lap but talked incessantly during walk and sats still 94% at end   Advised:   Make sure you check your oxygen saturation  at your highest level of activity  to be sure it stays over 90% and adjust  02 flow upward to maintain this level if needed but remember to turn it back to previous settings when you stop (to conserve your supply).

## 2020-11-19 NOTE — Patient Instructions (Addendum)
Plan A = Automatic = Always=    Anoro one click first thing in am     Plan B = Backup (to supplement plan A, not to replace it) Only use your albuterol inhaler as a rescue medication to be used if you can't catch your breath by resting or doing a relaxed purse lip breathing pattern.  - The less you use it, the better it will work when you need it. - Ok to use the inhaler up to 2 puffs  every 4 hours if you must but call for appointment if use goes up over your usual need - Don't leave home without it !!  (think of it like the spare tire for your car)   Plan C = Crisis (instead of Plan B but only if Plan B stops working) - only use your albuterol nebulizer if you first try Plan B and it fails to help > ok to use the nebulizer up to every 4 hours but if start needing it regularly call for immediate appointment   Ok to Try albuterol 15 min before an activity (on alternating days)  that you know would make you short of breath and see if it makes any difference and if makes none then don't take albuterol after activity unless you can't catch your breath as this means it's the resting that helps, not the albuterol.       Make sure you check your oxygen saturation  at your highest level of activity  to be sure it stays over 90% and adjust  02 flow upward to maintain this level if needed but remember to turn it back to previous settings when you stop (to conserve your supply).     Please schedule a follow up visit in 6 months but call sooner if needed

## 2020-11-19 NOTE — Progress Notes (Signed)
Subjective:    Patient ID: Timothy Maldonado, male    DOB: [birth year]     MRN: [id number]    Brief patient profile:  34  yowm  MZ/  Active smoker with new onset doe 2014 referred by Dr Renne Crigler to pulmonary clinic 01/26/2015 with document GOLD III COPD on 01/12/15    History of Present Illness  01/26/2015 1st [facility] office visit/ [name]   Chief Complaint  Patient presents with   Advice Only    Referred by Dr. Renne Crigler for SOB, DOE, weight loss  better since starting spiriva - only sob with exertion/ ok if paces himself slowly = MMR[facility] 2  rec Plan A = Automatic = spiriva 2 puffs each am Plan B= Backup - Only use your albuterol (proair - RED) as a rescue medication     Admit date: 04/07/2020 Discharge date: 04/08/2020 Home Health: None Equipment/Devices: Home oxygen, home nebulizer machine     HPI: Per admitting MD, Timothy Maldonado is a 70 y.o. male with medical history significant of  COPD-emphysema by CT chest and PFTs, hyperlipidemia, hypertension, atherosclerosis, and Thoracic AA who presents emergency department with shortness of breath.  At his baseline he gets SOB with exertion. For the past week he has been much worse unable to complete household tasks without resting. States that this morning he awoke and began coughing.  He states that he could not stop and then had severe difficulty breathing.  He was unable to get his oxygen saturations above 90% at home and presented to the emergency department.  He is a chronic daily smoker smoking 10-20 cig/day.  He is on albuterol and Anoro Ellipta.  Patient states that his breathing seemed a bit worse yesterday and that at times he felt "like I was mechanically breathing and had to think about each breath in and out."  He has been triple vaccinated against the coronavirus and also had his flu vaccine earlier this year.  He denies any fevers, chills, chest pain, history of CHF or volume overload.  He states that he has never been hospitalized  or even to the emergency department for his breathing before.   Hospital Course / Discharge diagnoses: COPD/emphysema -with acute exacerbation, patient wheezing on admission.  He was placed on nebulizers, steroids, he was initially requiring BiPAP but improved rapidly with treatment.  By hospital day 2, his wheezing has completely resolved, he was satting well on room air and able to ambulate and feeling back to baseline.  He does however require oxygen on discharge as he desats into the mid 80s on room air with walking.  Patient tells me this is chronic, he has checked his O2 sats at home with ambulation for a while now and he is regularly seeing sats in the low 80s.  He will be discharged on a prednisone taper, was not started on antibiotics due to not having a productive cough and chest x-ray was clear on admission.  He will be set up with home oxygen as well as a nebulizer machine at home. Essential hypertension-resume home medications on discharge Hyperlipidemia-resume home medications on discharge Tobacco use -counseled to quit     04/21/2020   ov/Makari Sanko re: re-establish s/p admit  maint on anoro  Chief Complaint  Patient presents with   Pulmonary Consult    Referred by Dr. [name]. Recent hospitalization 04/07/20-04/08/20 for COPD exacerbation- sent home on o2 and nebs. He has not been using the o2 consistantly.   Dyspnea: room to room  now /  Baseline and back to house 50-75 ft and flat and stops on landing  Cough: better > just clear mucus  Sleeping: on side/ flat bed  SABA use: once daily around lunch duoneb  02: not using - 02 is low 90s  Last prednisone 04/20/20  rec Plan A = Automatic = Always=    Anoro one click each am  Plan B = Backup (to supplement plan A, not to replace it) Only use your albuterol (prefer without ipatropium) nebulizer as a rescue medication  Make sure you check your oxygen saturations at highest level of activity  If can't comfortable with cramps or  breathing ok to try xanax 0.25 mg twice daily as needed      05/22/2020  f/u ov/Timothy Maldonado re: MZ/ copd  Anoro  Chief Complaint  Patient presents with   Follow-up    Breathing has improved slightly since the last visit. He does not have an albuterol inhaler right now and is using his albuterol neb on average once per day.   Dyspnea:  Mailbox is 75 ft flat / steps are more difficult  Cough: none  Sleeping: ok flat  SABA use: just using  02: prn 02 but does not check sats  Rec We will walk you today to check your need for portable 02  Make sure you check your oxygen saturations at highest level of activity to be sure it stays over 90%  You carry the gene that causes emphysema (alpha one)  and you should never smoker again and your son should be checked  Try albuterol 15 min before an activity that you know would make you short of breath        11/19/2020  f/u ov/Noheli Melder re: GOLD III Timothy Maldonado back smokng on anoro daily  Chief Complaint  Patient presents with   Follow-up    Breathing is about the same. He has good days and bad days. He rarely uses albuterol inhaler or neb.    Dyspnea:  better on 02  Cough: none Sleeping: ok off 02 SABA use: none 02: prn Covid status:   vax x 3  Wants to be able to travel to beach s concentrator   No obvious day to day or daytime variability or assoc excess/ purulent sputum or mucus plugs or hemoptysis or cp or chest tightness, subjective wheeze or overt sinus or hb symptoms.   Sleeping RA  without nocturnal  or early am exacerbation  of respiratory  c/o's or need for noct saba. Also denies any obvious fluctuation of symptoms with weather or environmental changes or other aggravating or alleviating factors except as outlined above   No unusual exposure hx or h/o childhood pna/ asthma or knowledge of premature birth.  Current Allergies, Complete Past Medical History, Past Surgical History, Family History, and Social History were reviewed in [employer] record.  ROS  The following are not active complaints unless bolded Hoarseness, sore throat, dysphagia, dental problems, itching, sneezing,  nasal congestion or discharge of excess mucus or purulent secretions, ear ache,   fever, chills, sweats, unintended wt loss or wt gain, classically pleuritic or exertional cp,  orthopnea pnd or arm/hand swelling  or leg swelling, presyncope, palpitations, abdominal pain, anorexia, nausea, vomiting, diarrhea  or change in bowel habits or change in bladder habits, change in stools or change in urine, dysuria, hematuria,  rash, arthralgias, visual complaints, headache, numbness, weakness or ataxia or problems with walking or coordination,  change in mood  or  memory.        Current Meds  Medication Sig   albuterol (PROVENTIL) (2.5 MG/3ML) 0.083% nebulizer solution Take 3 mLs (2.5 mg total) by nebulization every 4 (four) hours as needed for wheezing or shortness of breath.   albuterol (VENTOLIN HFA) 108 (90 Base) MCG/ACT inhaler Inhale 2 puffs into the lungs every 4 (four) hours as needed for wheezing or shortness of breath.   ALPRAZolam (XANAX) 0.25 MG tablet Take 1 tablet (0.25 mg total) by mouth 2 (two) times daily as needed for anxiety.   ANORO ELLIPTA 62.5-25 MCG/INH AEPB Inhale 1 puff into the lungs daily.   aspirin 81 MG tablet Take 81 mg by mouth daily.   CRESTOR 20 MG tablet Take 20 mg by mouth daily.    ezetimibe (ZETIA) 10 MG tablet Take 10 mg by mouth daily.   hydrochlorothiazide (MICROZIDE) 12.5 MG capsule Take 12.5 mg by mouth daily.   irbesartan (AVAPRO) 150 MG tablet Take 150 mg by mouth daily after supper.               .      Objective:   Physical Exam     11/19/2020         147  05/22/2020      145  04/21/2020       141   01/26/15 135 lb 3.2 oz (61.326 kg)  12/22/08 167 lb 9.6 oz (76.023 kg)  08/11/08 178 lb 6.4 oz (80.922 kg)     Vital signs reviewed  11/19/2020  - Note at rest 02 sats  96% on RA   General  appearance:    amb wm nad    HEENT : pt wearing mask not removed for exam due to covid -19 concerns.    NECK :  without JVD/Nodes/TM/ nl carotid upstrokes bilaterally   LUNGS: no acc muscle use,  Mod barrel  contour chest wall with bilateral  Distant bs s audible wheeze and  without cough on insp or exp maneuvers and mod  Hyperresonant  to  percussion bilaterally     CV:  RRR  no s3 or murmur or increase in P2, and no edema   ABD:  soft and nontender with pos mid insp Hoover's  in the supine position. No bruits or organomegaly appreciated, bowel sounds nl  MS:     ext warm without deformities, calf tenderness, cyanosis or clubbing No obvious joint restrictions   SKIN: warm and dry without lesions    NEURO:  alert, approp, nl sensorium with  no motor or cerebellar deficits apparent.                     Assessment & Plan:

## 2020-12-07 DIAGNOSIS — J449 Chronic obstructive pulmonary disease, unspecified: Secondary | ICD-10-CM | POA: Diagnosis not present

## 2020-12-09 DIAGNOSIS — J449 Chronic obstructive pulmonary disease, unspecified: Secondary | ICD-10-CM | POA: Diagnosis not present

## 2020-12-10 DIAGNOSIS — E7800 Pure hypercholesterolemia, unspecified: Secondary | ICD-10-CM | POA: Diagnosis not present

## 2020-12-10 DIAGNOSIS — I10 Essential (primary) hypertension: Secondary | ICD-10-CM | POA: Diagnosis not present

## 2020-12-10 DIAGNOSIS — I340 Nonrheumatic mitral (valve) insufficiency: Secondary | ICD-10-CM | POA: Diagnosis not present

## 2020-12-10 DIAGNOSIS — I6529 Occlusion and stenosis of unspecified carotid artery: Secondary | ICD-10-CM | POA: Diagnosis not present

## 2020-12-28 ENCOUNTER — Emergency Department (HOSPITAL_BASED_OUTPATIENT_CLINIC_OR_DEPARTMENT_OTHER): Payer: Medicare HMO

## 2020-12-28 ENCOUNTER — Emergency Department (HOSPITAL_BASED_OUTPATIENT_CLINIC_OR_DEPARTMENT_OTHER)
Admission: EM | Admit: 2020-12-28 | Discharge: 2020-12-28 | Disposition: A | Discharge status: Home / Self Care | Payer: Medicare HMO | Attending: Emergency Medicine | Admitting: Emergency Medicine

## 2020-12-28 ENCOUNTER — Other Ambulatory Visit: Payer: Self-pay

## 2020-12-28 DIAGNOSIS — J441 Chronic obstructive pulmonary disease with (acute) exacerbation: Secondary | ICD-10-CM

## 2020-12-28 DIAGNOSIS — R69 Illness, unspecified: Secondary | ICD-10-CM | POA: Diagnosis not present

## 2020-12-28 DIAGNOSIS — J189 Pneumonia, unspecified organism: Secondary | ICD-10-CM | POA: Diagnosis not present

## 2020-12-28 DIAGNOSIS — Z79899 Other long term (current) drug therapy: Secondary | ICD-10-CM | POA: Insufficient documentation

## 2020-12-28 DIAGNOSIS — Z7951 Long term (current) use of inhaled steroids: Secondary | ICD-10-CM | POA: Diagnosis not present

## 2020-12-28 DIAGNOSIS — Z7982 Long term (current) use of aspirin: Secondary | ICD-10-CM | POA: Insufficient documentation

## 2020-12-28 DIAGNOSIS — I700 Atherosclerosis of aorta: Secondary | ICD-10-CM | POA: Insufficient documentation

## 2020-12-28 DIAGNOSIS — J439 Emphysema, unspecified: Secondary | ICD-10-CM | POA: Diagnosis not present

## 2020-12-28 DIAGNOSIS — R0602 Shortness of breath: Secondary | ICD-10-CM | POA: Diagnosis not present

## 2020-12-28 DIAGNOSIS — I10 Essential (primary) hypertension: Secondary | ICD-10-CM | POA: Diagnosis not present

## 2020-12-28 DIAGNOSIS — Z20822 Contact with and (suspected) exposure to covid-19: Secondary | ICD-10-CM | POA: Diagnosis not present

## 2020-12-28 DIAGNOSIS — F17210 Nicotine dependence, cigarettes, uncomplicated: Secondary | ICD-10-CM | POA: Diagnosis not present

## 2020-12-28 DIAGNOSIS — R000 Tachycardia, unspecified: Secondary | ICD-10-CM | POA: Diagnosis not present

## 2020-12-28 LAB — BASIC METABOLIC PANEL
Anion gap: 10 (ref 5–15)
BUN: 19 mg/dL (ref 8–23)
CO2: 26 mmol/L (ref 22–32)
Calcium: 9.2 mg/dL (ref 8.9–10.3)
Chloride: 99 mmol/L (ref 98–111)
Creatinine, Ser: 0.87 mg/dL (ref 0.61–1.24)
GFR, Estimated: >60 mL/min (ref >60)
Glucose, Bld: 106 mg/dL — ABNORMAL HIGH (ref 70–99)
Potassium: 4.3 mmol/L (ref 3.5–5.1)
Sodium: 135 mmol/L (ref 135–145)

## 2020-12-28 LAB — CBC
HCT: 51.9 % (ref 39.0–52.0)
Hemoglobin: 17.1 g/dL — ABNORMAL HIGH (ref 13.0–17.0)
MCH: 30.1 pg (ref 26.0–34.0)
MCHC: 32.9 g/dL (ref 30.0–36.0)
MCV: 91.4 fL (ref 80.0–100.0)
Platelets: 238 10*3/uL (ref 150–400)
RBC: 5.68 MIL/uL (ref 4.22–5.81)
RDW: 14 % (ref 11.5–15.5)
WBC: 16.7 10*3/uL — ABNORMAL HIGH (ref 4.0–10.5)
nRBC: 0 % (ref 0.0–0.2)

## 2020-12-28 LAB — SARS CORONAVIRUS 2 (TAT 6-24 HRS): SARS Coronavirus 2: NEGATIVE

## 2020-12-28 LAB — TROPONIN I (HIGH SENSITIVITY): Troponin I (High Sensitivity): 9 ng/L (ref <18)

## 2020-12-28 LAB — LACTIC ACID, PLASMA: Lactic Acid, Venous: 1.1 mmol/L (ref 0.5–1.9)

## 2020-12-28 LAB — BRAIN NATRIURETIC PEPTIDE: B Natriuretic Peptide: 71.1 pg/mL (ref 0.0–100.0)

## 2020-12-28 MED ORDER — SODIUM CHLORIDE 0.9 % IV SOLN
500.0000 mg | Freq: Once | INTRAVENOUS | Status: AC
  Administered 2020-12-28: 500 mg via INTRAVENOUS
  Filled 2020-12-28: qty 500

## 2020-12-28 MED ORDER — METHYLPREDNISOLONE SODIUM SUCC 125 MG IJ SOLR
125.0000 mg | Freq: Once | INTRAMUSCULAR | Status: AC
  Administered 2020-12-28: 125 mg via INTRAVENOUS
  Filled 2020-12-28: qty 2

## 2020-12-28 MED ORDER — IPRATROPIUM BROMIDE HFA 17 MCG/ACT IN AERS
2.0000 | INHALATION_SPRAY | Freq: Once | RESPIRATORY_TRACT | Status: AC
  Administered 2020-12-28: 2 via RESPIRATORY_TRACT

## 2020-12-28 MED ORDER — CEFDINIR 300 MG PO CAPS: 300.0000 mg | ORAL_CAPSULE | Freq: Two times a day (BID) | ORAL | 0 refills | Status: DC

## 2020-12-28 MED ORDER — SODIUM CHLORIDE 0.9 % IV SOLN
1.0000 g | Freq: Once | INTRAVENOUS | Status: AC
  Administered 2020-12-28: 1 g via INTRAVENOUS
  Filled 2020-12-28: qty 10

## 2020-12-28 MED ORDER — ALBUTEROL SULFATE HFA 108 (90 BASE) MCG/ACT IN AERS
4.0000 | INHALATION_SPRAY | Freq: Once | RESPIRATORY_TRACT | Status: AC
  Administered 2020-12-28: 4 via RESPIRATORY_TRACT

## 2020-12-28 MED ORDER — IPRATROPIUM BROMIDE HFA 17 MCG/ACT IN AERS
2.0000 | INHALATION_SPRAY | Freq: Once | RESPIRATORY_TRACT | Status: AC
  Administered 2020-12-28: 2 via RESPIRATORY_TRACT
  Filled 2020-12-28: qty 12.9

## 2020-12-28 MED ORDER — ALBUTEROL SULFATE HFA 108 (90 BASE) MCG/ACT IN AERS
4.0000 | INHALATION_SPRAY | Freq: Once | RESPIRATORY_TRACT | Status: AC
  Administered 2020-12-28: 4 via RESPIRATORY_TRACT
  Filled 2020-12-28: qty 6.7

## 2020-12-28 MED ORDER — AZITHROMYCIN 250 MG PO TABS: 250.0000 mg | ORAL_TABLET | Freq: Every day | ORAL | 0 refills | Status: AC

## 2020-12-28 MED ORDER — IOHEXOL 300 MG/ML  SOLN
100.0000 mL | Freq: Once | INTRAMUSCULAR | Status: AC | PRN
  Administered 2020-12-28: 75 mL via INTRAVENOUS

## 2020-12-28 MED ORDER — PREDNISONE 20 MG PO TABS: ORAL_TABLET | ORAL | 0 refills | Status: DC

## 2020-12-28 NOTE — ED Notes (Signed)
Pt up to restroom with assistance nd O2

## 2020-12-28 NOTE — ED Notes (Signed)
Pt back from restroom

## 2020-12-28 NOTE — Discharge Instructions (Addendum)
It was our pleasure to provide your ER care today - we hope that you feel better.  Take antibiotics as prescribed. Use albuterol treatment every 4 hrs as need. Take prednisone as prescribed. Avoid any smoking.   Follow up with your doctor/lung specialist in the next couple weeks. Also have them repeat chest xray to make sure it clears.   Your covid/flu test is pending and should be resulted in the next 1-2 hrs - you may check MyChart or call for results.   Return to ER if worse, new symptoms, chest pain, increased trouble breathing, weak/fainting, or other concern.

## 2020-12-28 NOTE — ED Notes (Signed)
Patient transported to CT 

## 2020-12-28 NOTE — ED Provider Notes (Signed)
[facility] EMERGENCY DEPARTMENT Provider Note   CSN: [id number] Arrival date & time: 12/28/20  1451     History Chief Complaint  Patient presents with   Tachycardia    Timothy Maldonado is a 70 y.o. male.  Patient with hx copd, smoker, c/o feeling sob/increased wob 'panting', for the past few days. Symptoms acute onset, moderate, persistent. States worse w exertion, or got worse the other day after mowing yard. Denies chest pain. No pleuritic pain. +increased non prod cough. No sore throat or runny nose. No fever or chills. No known covid exposure. Denies leg pain or swelling. No orthopnea or pnd. Uses home o2. Uses home mdi/neb prn, none today. No recent oral steroid use. No hx cad. No hx dvt  or pe. No recent immobility, surgery, trauma, or hemoptysis.  The history is provided by the patient and the spouse.      Past Medical History:  Diagnosis Date   Bursitis    Carotid atherosclerosis    COPD (chronic obstructive pulmonary disease) (HCC)    High cholesterol    Hyperlipidemia, group A 07/21/2018   Hypertension    Libido, decreased    Low back pain    Plantar wart    Screening for abdominal aortic aneurysm (AAA) performed 07/21/2018   Abdominal aortic duplex 01/04/2018: The maximum aorta diameter is 2.48 cm (prox).  Ectasia of the abdominal aorta measuring 2.48 x 2.4 x 2.47 cm is seen.  Focal plaque observed in the proximal, mid and distal aorta. Peak systolic velocities in the left internal iliac artery are mildly increased suggests <50% stenosis. Enlarged inferior vena cava with normal respiratory variation suggests elevated    Thoracic ascending aortic aneurysm (HCC) 03/31/2016   CT chest for lung cancer screening: 4.1 Asc Ao Aneurysm. 3 vessel coronary calcification    Patient Active Problem List   Diagnosis Date Noted   DOE (dyspnea on exertion) 04/21/2020   COPD exacerbation (HCC) 04/08/2020   Respiratory failure, acute (HCC) 04/07/2020   Hyperlipidemia, group  A 07/21/2018   Screening for abdominal aortic aneurysm (AAA) performed 07/21/2018   Thoracic ascending aortic aneurysm (HCC) 03/31/2016   COPD  GOLD III/ MZ 01/27/2015   COUGH 08/11/2008   Adjustment disorder with anxiety 06/27/2007   HYPERLIPIDEMIA 01/01/2007   Cigarette smoker 01/01/2007   Essential hypertension 01/01/2007    No past surgical history on file.     Family History  Problem Relation Age of Onset   Heart disease Father     Social History   Tobacco Use   Smoking status: Every Day    Packs/day: 1.00    Years: 48.00    Pack years: 48.00    Types: Cigarettes    Last attempt to quit: 04/07/2020    Years since quitting: 0.7   Smokeless tobacco: Never  Vaping Use   Vaping Use: Never used  Substance Use Topics   Alcohol use: Yes    Alcohol/week: 0.0 standard drinks    Comment: occasionally   Drug use: No    Home Medications Prior to Admission medications   Medication Sig Start Date End Date Taking? Authorizing Provider  albuterol (PROVENTIL) (2.5 MG/3ML) 0.083% nebulizer solution Take 3 mLs (2.5 mg total) by nebulization every 4 (four) hours as needed for wheezing or shortness of breath. 04/21/20   Nyoka Cowden, MD  albuterol (VENTOLIN HFA) 108 (90 Base) MCG/ACT inhaler Inhale 2 puffs into the lungs every 4 (four) hours as needed for wheezing or shortness of breath.  05/22/20   Nyoka Cowden, MD  ALPRAZolam Prudy Feeler) 0.25 MG tablet Take 1 tablet (0.25 mg total) by mouth 2 (two) times daily as needed for anxiety. 04/08/20 04/08/21  Leatha Gilding, MD  ANORO ELLIPTA 62.5-25 MCG/INH AEPB Inhale 1 puff into the lungs daily. 07/04/18   [provider]  aspirin 81 MG tablet Take 81 mg by mouth daily.    [provider]  CRESTOR 20 MG tablet Take 20 mg by mouth daily.  01/05/15   [provider]  ezetimibe (ZETIA) 10 MG tablet Take 10 mg by mouth daily. 11/20/19   [provider]  hydrochlorothiazide (MICROZIDE) 12.5 MG capsule Take  12.5 mg by mouth daily.    [provider]  irbesartan (AVAPRO) 150 MG tablet Take 150 mg by mouth daily after supper.     [provider]    Allergies    Sulfonamide derivatives  Review of Systems   Review of Systems  Constitutional:  Negative for chills and fever.  HENT:  Negative for sore throat.   Eyes:  Negative for redness.  Respiratory:  Positive for cough, shortness of breath and wheezing.   Cardiovascular:  Negative for chest pain, palpitations and leg swelling.  Gastrointestinal:  Negative for abdominal pain and vomiting.  Genitourinary:  Negative for flank pain.  Musculoskeletal:  Negative for back pain and neck pain.  Skin:  Negative for rash.  Neurological:  Negative for headaches.  Hematological:  Does not bruise/bleed easily.  Psychiatric/Behavioral:  Negative for confusion.    Physical Exam Updated Vital Signs BP (!) 133/92   Pulse (!) 108   Temp 99.4 F (37.4 C) (Oral)   Resp 20   Ht 1.778 m (5\' 10" )   Wt 63.5 kg   SpO2 96%   BMI 20.09 kg/m   Physical Exam Vitals and nursing note reviewed.  Constitutional:      Appearance: Normal appearance. He is well-developed.  HENT:     Head: Atraumatic.     Nose: Nose normal.     Mouth/Throat:     Mouth: Mucous membranes are moist.     Pharynx: Oropharynx is clear.  Eyes:     General: No scleral icterus.    Conjunctiva/sclera: Conjunctivae normal.     Pupils: Pupils are equal, round, and reactive to light.  Neck:     Trachea: No tracheal deviation.  Cardiovascular:     Rate and Rhythm: Regular rhythm. Tachycardia present.     Pulses: Normal pulses.     Heart sounds: Normal heart sounds. No murmur heard.   No friction rub. No gallop.  Pulmonary:     Effort: Pulmonary effort is normal. No accessory muscle usage or respiratory distress.     Breath sounds: Normal breath sounds.     Comments: Decreased  air movement, diminished breath sounds bilaterally. Wheezing bil.  Abdominal:      General: Bowel sounds are normal. There is no distension.     Palpations: Abdomen is soft.     Tenderness: There is no abdominal tenderness. There is no guarding.  Genitourinary:    Comments: No cva tenderness. Musculoskeletal:        General: No swelling or tenderness.     Cervical back: Normal range of motion and neck supple. No rigidity.     Right lower leg: No edema.     Left lower leg: No edema.  Skin:    General: Skin is warm and dry.     Findings: No  rash.  Neurological:     Mental Status: He is alert.     Comments: Alert, speech clear.   Psychiatric:        Mood and Affect: Mood normal.    ED Results / Procedures / Treatments   Labs (all labs ordered are listed, but only abnormal results are displayed) Results for orders placed or performed during the hospital encounter of 12/28/20  Basic metabolic panel  Result Value Ref Range   Sodium 135 135 - 145 mmol/L   Potassium 4.3 3.5 - 5.1 mmol/L   Chloride 99 98 - 111 mmol/L   CO2 26 22 - 32 mmol/L   Glucose, Bld 106 (H) 70 - 99 mg/dL   BUN 19 8 - 23 mg/dL   Creatinine, Ser 5.00 0.61 - 1.24 mg/dL   Calcium 9.2 8.9 - 93.8 mg/dL   GFR, Estimated >18 >29 mL/min   Anion gap 10 5 - 15  CBC  Result Value Ref Range   WBC 16.7 (H) 4.0 - 10.5 K/uL   RBC 5.68 4.22 - 5.81 MIL/uL   Hemoglobin 17.1 (H) 13.0 - 17.0 g/dL   HCT 93.7 16.9 - 67.8 %   MCV 91.4 80.0 - 100.0 fL   MCH 30.1 26.0 - 34.0 pg   MCHC 32.9 30.0 - 36.0 g/dL   RDW 93.8 10.1 - 75.1 %   Platelets 238 150 - 400 K/uL   nRBC 0.0 0.0 - 0.2 %  Brain natriuretic peptide  Result Value Ref Range   B Natriuretic Peptide 71.1 0.0 - 100.0 pg/mL  Lactic acid, plasma  Result Value Ref Range   Lactic Acid, Venous 1.1 0.5 - 1.9 mmol/L  Troponin I (High Sensitivity)  Result Value Ref Range   Troponin I (High Sensitivity) 9 <18 ng/L   DG Chest 2 View  Result Date: 12/28/2020 CLINICAL DATA:  Shortness of breath EXAM: CHEST - 2 VIEW COMPARISON:  04/07/2020, CT 05/12/2020  FINDINGS: Hyperinflation with emphysema. Vague focal opacity in the right upper lobe. Normal cardiomediastinal silhouette. No pneumothorax. IMPRESSION: 1. Hyperinflation with emphysema 2. Vague focal opacity in the right upper lobe indeterminate for small focus of pneumonia versus pulmonary nodule. If symptoms are suggestive of infection, suggest short interval radiographic follow-up following antibiotic trial. Alternatively chest CT could be obtained for further evaluation. Electronically Signed   By: Jasmine Pang M.D.   On: 12/28/2020 15:43     EKG EKG Interpretation  Date/Time:  Monday December 28 2020 14:57:28 EDT Ventricular Rate:  105 PR Interval:  134 QRS Duration: 122 QT Interval:  338 QTC Calculation: 446 R Axis:   96 Text Interpretation: Sinus tachycardia Right bundle branch block Non-specific ST-t changes Confirmed by Cathren Laine ([id number]) on 12/28/2020 3:07:15 PM  Radiology DG Chest 2 View  Result Date: 12/28/2020 CLINICAL DATA:  Shortness of breath EXAM: CHEST - 2 VIEW COMPARISON:  04/07/2020, CT 05/12/2020 FINDINGS: Hyperinflation with emphysema. Vague focal opacity in the right upper lobe. Normal cardiomediastinal silhouette. No pneumothorax. IMPRESSION: 1. Hyperinflation with emphysema 2. Vague focal opacity in the right upper lobe indeterminate for small focus of pneumonia versus pulmonary nodule. If symptoms are suggestive of infection, suggest short interval radiographic follow-up following antibiotic trial. Alternatively chest CT could be obtained for further evaluation. Electronically Signed   By: Jasmine Pang M.D.   On: 12/28/2020 15:43   CT Chest W Contrast  Result Date: 12/28/2020 CLINICAL DATA:  Possible pulmonary nodule on chest x-ray today. EXAM: CT CHEST WITH CONTRAST TECHNIQUE: Multidetector  CT imaging of the chest was performed during intravenous contrast administration. CONTRAST:  75 mL OMNIPAQUE IOHEXOL 300 MG/ML  SOLN COMPARISON:  Single-view of the chest  04/07/2020. CT chest 05/12/2020. FINDINGS: Cardiovascular: No significant vascular findings. Normal heart size. No pericardial effusion. Calcific aortic and coronary atherosclerosis noted. Mediastinum/Nodes: No enlarged mediastinal, hilar, or axillary lymph nodes. Thyroid gland, trachea, and esophagus demonstrate no significant findings. Lungs/Pleura: No pleural effusion. Emphysema again seen. No pulmonary nodule is identified. Patchy airspace disease in the right upper and right middle lobes has an appearance most consistent with an infectious or inflammatory process. The lungs are otherwise clear. No pleural fluid. Upper Abdomen: Left renal cysts are partially imaged. Otherwise negative. Musculoskeletal: No acute or focal abnormality. IMPRESSION: Patchy foci of airspace disease in the right upper and right middle lobes are most worrisome for bronchopneumonia. Negative for pulmonary nodule. Aortic Atherosclerosis (ICD10-I70.0) and Emphysema (ICD10-J43.9). Electronically Signed   By: Drusilla Kannerhomas  Dalessio M.D.   On: 12/28/2020 18:34    Procedures Procedures   Medications Ordered in ED Medications  methylPREDNISolone sodium succinate (SOLU-MEDROL) 125 mg/2 mL injection 125 mg (125 mg Intravenous Given 12/28/20 1542)  albuterol (VENTOLIN HFA) 108 (90 Base) MCG/ACT inhaler 4 puff (4 puffs Inhalation Given 12/28/20 1545)  ipratropium (ATROVENT HFA) inhaler 2 puff (2 puffs Inhalation Given 12/28/20 1545)    ED Course  I have reviewed the triage vital signs and the nursing notes.  Pertinent labs & imaging results that were available during my care of the patient were reviewed by me and considered in my medical decision making (see chart for details).    MDM Rules/Calculators/A&P                         Iv ns. Labs. Cxr. Albuterol and atrovent tx. Solumedrol iv.   Reviewed nursing notes and prior charts for additional history.   Labs reviewed/interpreted by me - wbc elevated.   CXR reviewed/interpreted by  me - ? Right pna.   Will check lactate, covid, and cultures. Will cover w iv abx.   Additional albuterol/atrovent tx.   Recheck pt, offered admission. Pt indicates he feels is breathing much better, at baseline, even w ambulating to bathroom, and states he prefers to go home/spouse agreeable.   Pt w improved air movement. Wheezing improved. No cp.   Pt currently appears stable for d/c.   Return precautions provided.      Final Clinical Impression(s) / ED Diagnoses Final diagnoses:  None    Rx / DC Orders ED Discharge Orders     None        Cathren LaineSteinl, Majestic Molony, MD 12/28/20 2037

## 2020-12-28 NOTE — ED Triage Notes (Addendum)
Pt c/o tachycardia, diaphoresis, fatigue, "panting" when he "does much of anything" x 3 days-states no changes in O2 sat sats level-denies pain including denies CP-NAD-to triage in w/c with home O2-pt states he has increased O2 from 2L [place] to 3L [place] at present-wife feels pt has "carbon monoxide poisoning"

## 2020-12-28 NOTE — ED Notes (Signed)
Pt return from CT.

## 2020-12-28 NOTE — ED Notes (Signed)
Pt given decaf coffee per request

## 2021-01-02 LAB — CULTURE, BLOOD (ROUTINE X 2)
Culture: NO GROWTH
Culture: NO GROWTH
Specimen Description: ADEQUATE
Specimen Description: ADEQUATE

## 2021-01-06 DIAGNOSIS — J449 Chronic obstructive pulmonary disease, unspecified: Secondary | ICD-10-CM | POA: Diagnosis not present

## 2021-01-08 DIAGNOSIS — J449 Chronic obstructive pulmonary disease, unspecified: Secondary | ICD-10-CM | POA: Diagnosis not present

## 2021-01-28 ENCOUNTER — Ambulatory Visit: Payer: Medicare HMO

## 2021-01-28 ENCOUNTER — Other Ambulatory Visit: Payer: Self-pay

## 2021-01-28 DIAGNOSIS — I340 Nonrheumatic mitral (valve) insufficiency: Secondary | ICD-10-CM | POA: Diagnosis not present

## 2021-02-04 ENCOUNTER — Encounter: Payer: Self-pay | Admitting: Cardiology

## 2021-02-04 ENCOUNTER — Ambulatory Visit: Payer: Medicare HMO | Admitting: Cardiology

## 2021-02-04 ENCOUNTER — Other Ambulatory Visit: Payer: Self-pay

## 2021-02-04 VITALS — BP 136/89 | HR 86 | Temp 96.3°F | Resp 17 | Ht 70.0 in | Wt 141.8 lb

## 2021-02-04 DIAGNOSIS — I340 Nonrheumatic mitral (valve) insufficiency: Secondary | ICD-10-CM | POA: Diagnosis not present

## 2021-02-04 DIAGNOSIS — I2510 Atherosclerotic heart disease of native coronary artery without angina pectoris: Secondary | ICD-10-CM | POA: Diagnosis not present

## 2021-02-04 DIAGNOSIS — E7800 Pure hypercholesterolemia, unspecified: Secondary | ICD-10-CM

## 2021-02-04 DIAGNOSIS — J9611 Chronic respiratory failure with hypoxia: Secondary | ICD-10-CM | POA: Diagnosis not present

## 2021-02-04 NOTE — Progress Notes (Signed)
Primary Physician/Referring:  Timothy Pretty, MD  Patient ID: Timothy Maldonado, male    DOB: [birth year], 69 y.o.   MRN: [id number]  Chief Complaint  Patient presents with   Follow-up    1 YEAR   Shortness of Breath   Mitral Regurgitation   HPI:    Timothy Maldonado  is a 70 y.o. male  with COPD with centrilobular emphysema, chronic shortness of breath and ?4.1 cm Asc Aortic aneurysm and hypertension presents for a 1 year follow up.  He has a history of ascending aortic aneurysm by CT scan done for lung cancer screening and noted to have ascending aortic dilatation of 4.1 cm in 2017 with subsequent scans not mentioning AAA, also echocardiogram in 2019 and also in 2020 also did not reveal ascending aortic aneurysm.  His main issue is severe hypoxemic respiratory failure as he continues to smoke.  Recent hospitalization evaluated, he had pneumonia, normal increased oxygen requirement, requesting a portable oxygen concentrator.  He is accompanied by his wife.  He has not had any chest pain, leg edema.    Past Medical History:  Diagnosis Date   Bursitis    Carotid atherosclerosis    COPD (chronic obstructive pulmonary disease) (HCC)    High cholesterol    Hyperlipidemia, group A 07/21/2018   Hypertension    Libido, decreased    Low back pain    Plantar wart    Screening for abdominal aortic aneurysm (AAA) performed 07/21/2018   Abdominal aortic duplex 01/04/2018: The maximum aorta diameter is 2.48 cm (prox).  Ectasia of the abdominal aorta measuring 2.48 x 2.4 x 2.47 cm is seen.  Focal plaque observed in the proximal, mid and distal aorta. Peak systolic velocities in the left internal iliac artery are mildly increased suggests <50% stenosis. Enlarged inferior vena cava with normal respiratory variation suggests elevated    Thoracic ascending aortic aneurysm ([facility]) 03/31/2016   CT chest for lung cancer screening: 4.1 Asc Ao Aneurysm. 3 vessel coronary calcification   History reviewed. No pertinent  surgical history.   Social History   Tobacco Use   Smoking status: Former    Packs/day: 1.00    Years: 48.00    Pack years: 48.00    Types: Cigarettes    Quit date: 03/10/2020    Years since quitting: 0.9   Smokeless tobacco: Never  Substance Use Topics   Alcohol use: Yes    Alcohol/week: 0.0 standard drinks    Comment: occasionally   Marital Status: Married   ROS  Review of Systems  Cardiovascular:  Positive for dyspnea on exertion (chronic). Negative for chest pain, leg swelling, palpitations and syncope.  Respiratory:  Positive for cough, sputum production and wheezing.   Gastrointestinal:  Negative for melena.  Objective  Blood pressure 136/89, pulse 86, temperature (!) 96.3 F (35.7 C), temperature source Temporal, resp. rate 17, height 5' 10"  (1.778 m), weight 141 lb 12.8 oz (64.3 kg), SpO2 100 %. Body mass index is 20.35 kg/m.  Vitals with BMI 02/04/2021 02/04/2021 12/28/2020  Height - 5' 10"  -  Weight - 141 lbs 13 oz -  BMI - 37.48 -  Systolic 270 81 786  Diastolic 89 41 83  Pulse 86 104 92      Physical Exam Constitutional:      General: He is not in acute distress.    Comments: lean  Eyes:     Conjunctiva/sclera: Conjunctivae normal.  Neck:     Thyroid: No thyromegaly.  Vascular: No JVD.  Cardiovascular:     Rate and Rhythm: Normal rate and regular rhythm.     Pulses: Intact distal pulses.          Carotid pulses are 2+ on the right side and 2+ on the left side.      Radial pulses are 2+ on the right side and 2+ on the left side.       Femoral pulses are 1+ on the right side and 1+ on the left side.      Popliteal pulses are 1+ on the right side and 1+ on the left side.       Dorsalis pedis pulses are 0 on the right side and 0 on the left side.       Posterior tibial pulses are 1+ on the right side and 1+ on the left side.     Heart sounds: Normal heart sounds. No murmur heard.   No gallop.     Comments: No edema. Pulmonary:     Effort: Pulmonary  effort is normal. Tachypnea present. No respiratory distress.     Breath sounds: Rales (bilateral diffuse extensive) present.     Comments: Barrel shapedd chest. Prolonged expiration Abdominal:     General: Bowel sounds are normal.     Palpations: Abdomen is soft.  Musculoskeletal:        General: Normal range of motion.  Skin:    General: Skin is warm and dry.   Radiology: No results found.  Laboratory examination:   Recent Labs    04/07/20 0912 04/07/20 1421 04/08/20 0406 04/21/20 1143 12/28/20 1512  NA 135   < > 137 136 135  K 4.3   < > 4.1 5.0 4.3  CL 100  --  101 96 99  CO2 20*  --  23 30 26   GLUCOSE 126*  --  126* 72 106*  BUN 18  --  25* 18 19  CREATININE 0.89  --  0.86 1.05 0.87  CALCIUM 9.8  --  9.3 10.0 9.2  GFRNONAA >60  --  >60  --  >60   < > = values in this interval not displayed.   CMP Latest Ref Rng & Units 12/28/2020 04/21/2020 04/08/2020  Glucose 70 - 99 mg/dL 106(H) 72 126(H)  BUN 8 - 23 mg/dL 19 18 25(H)  Creatinine 0.61 - 1.24 mg/dL 0.87 1.05 0.86  Sodium 135 - 145 mmol/L 135 136 137  Potassium 3.5 - 5.1 mmol/L 4.3 5.0 4.1  Chloride 98 - 111 mmol/L 99 96 101  CO2 22 - 32 mmol/L 26 30 23   Calcium 8.9 - 10.3 mg/dL 9.2 10.0 9.3  Total Protein 6.0 - 8.3 g/dL - - -  Total Bilirubin 0.3 - 1.2 mg/dL - - -  Alkaline Phos 39 - 117 units/L - - -  AST 0 - 37 units/L - - -  ALT 0 - 53 units/L - - -   CBC Latest Ref Rng & Units 12/28/2020 04/21/2020 04/08/2020  WBC 4.0 - 10.5 K/uL 16.7(H) 15.3(H) 15.3(H)  Hemoglobin 13.0 - 17.0 g/dL 17.1(H) 16.8 15.0  Hematocrit 39.0 - 52.0 % 51.9 51.9 45.2  Platelets 150 - 400 K/uL 238 323.0 245   Lipid Panel     Component Value Date/Time   CHOL 207 (HH) 08/11/2008 1122   TRIG 97 08/11/2008 1122   HDL 40.1 08/11/2008 1122   CHOLHDL 5.2 CALC 08/11/2008 1122   VLDL 19 08/11/2008 1122   LDLCALC 135 (H) 06/27/2007  0920   LDLDIRECT 150.3 08/11/2008 1122   HEMOGLOBIN A1C No results found for: HGBA1C, MPG TSH Recent  Labs    04/21/20 1143  TSH 3.83     External Labs:   Labs 03/31/2020:  Hb 16.8/HCT 51.1, platelets 262, normal indicis.  BUN 20, creatinine 1.0, EGFR 78 mL, potassium 4.0.  CMP otherwise normal.  Total cholesterol 164, triglycerides 112, HDL 52, LDL 92.  Non-HDL cholesterol 112.   Cholesterol, total 167.000 M 01/14/2016 HDL 53.000 MG 01/14/2016 LDL-C 95.000 02/12/2019 Triglycerides 64.000 02/12/2019  02/20/2019: eGFR 73.970   Medications    Current Outpatient Medications  Medication Instructions   albuterol (PROVENTIL) 2.5 mg, Nebulization, Every 4 hours PRN   albuterol (VENTOLIN HFA) 108 (90 Base) MCG/ACT inhaler 2 puffs, Inhalation, Every 4 hours PRN   ALPRAZolam (XANAX) 0.25 mg, Oral, 2 times daily PRN   ANORO ELLIPTA 62.5-25 MCG/INH AEPB 1 puff, Inhalation, Daily   aspirin 81 mg, Oral, Daily   Crestor 20 mg, Oral, Daily   ezetimibe (ZETIA) 10 mg, Oral, Daily   hydrochlorothiazide (MICROZIDE) 12.5 mg, Oral, Daily   irbesartan (AVAPRO) 150 mg, Oral, Daily after supper    Cardiac Studies:   Chest CT for lung cancer screening 04/04/2017, compared to 03/31/2016: Cardiovascular: The heart size is normal. No pericardial effusion. Coronary artery calcification is noted. Ascending thoracic aorta measures 4.1 cm maximum diameter. Atherosclerotic calcification is noted in the wall of the thoracic aorta.  No change from prior study   CT angiogram of the chest with contrast 12/28/2020: Patchy foci of airspace disease in the right upper and right middle lobes are most worrisome for bronchopneumonia. Negative for pulmonary nodule. Aortic Atherosclerosis (ICD10-I70.0) and Emphysema (ICD10-J43.9).  Nuclear stress test   Lexiscan myoview stress test 11/03/2017: 1. The resting electrocardiogram demonstrated normal sinus rhythm, normal resting conduction, no resting arrhythmias and normal rest repolarization. Stress EKG is non-diagnostic for ischemia as it a pharmacologic stress using  Lexiscan. Patient developed frequent PVC in bigeminal pattern in recovery. Stress symptoms included dizziness. 2. Myocardial perfusion imaging is normal. Overall left ventricular systolic function was normal without regional wall motion abnormalities. The left ventricular ejection fraction was 66%. This is an intermediate risk study, clinical correlation recommended.   Abdominal aortic duplex 01/04/2018: The maximum aorta diameter is 2.48 cm (prox). Ectasia of the abdominal aorta measuring 2.48 x 2.4 x 2.47 cm is seen. Focal plaque observed in the proximal, mid and distal aorta. Peak systolic velocities in the left internal iliac artery are mildly increased suggests <50% stenosis. Enlarged inferior vena cava with normal respiratory variation suggests elevated central venous pressure.  PCV ECHOCARDIOGRAM COMPLETE 40/34/7425 Normal LV systolic function with EF 57%. Left ventricle cavity is normal in size. Normal left ventricular wall thickness. Normal global wall motion. Indeterminate diastolic filling pattern. Calculated EF 57%. MV appears to be normal with no significant myxomatous degeneration. Mild prolapse of the mitral valve leaflets with mostly posteriorly directed moderate (Grade III) mitral regurgitation. Structurally normal tricuspid valve.  Mild tricuspid regurgitation. No evidence of pulmonary hypertension. No significant change from 01/15/2019.   EKG:  EKG 02/04/2021: Normal sinus rhythm at the rate of 89 bpm, normal axis, right bundle branch block.  Single PAC.  No significant change from 02/08/2020.  Assessment     ICD-10-CM   1. Coronary artery calcification seen on CAT scan  I25.10 EKG 12-Lead    2. Thoracic ascending aortic aneurysm (HCC)  I71.2     3. Moderate mitral regurgitation  I34.0  4. Hypoxemic respiratory failure, chronic ([facility])  J96.11     5. Hyperlipidemia, group A  E78.00       Recommendations:   Alasdair Kleve  is a 70 y.o. male  with COPD with  centrilobular emphysema, chronic shortness of breath and ?4.1 cm Asc Aortic aneurysm and hypertension presents for a 1 year follow up.  He has a history of ascending aortic aneurysm by CT scan done for lung cancer screening and noted to have ascending aortic dilatation of 4.1 cm in 2017 with subsequent scans not mentioning AAA, also echocardiogram in 2019 and also in 2020 also did not reveal ascending aortic aneurysm.  His main issue is severe hypoxemic respiratory failure as he continues to smoke.  Recent hospitalization records reviewed.  Recent CT angiogram of the chest also reviewed, again reveals no evidence of aortic aneurysm.  I also reviewed the recently performed echocardiogram, which again does not reveal any evidence of aortic root dilatation.  I reassured him, advised him to continue ARB for now.  I reviewed the echocardiogram with regard to mitral regurgitation, his dyspnea is not related to MR.  There is no clinical evidence of heart failure in spite of severe COPD, he has not had any RV failure as well.  Right ventricular size has been normal by recent echocardiogram as well.  I encouraged him to continue to remain abstinent.  He is presently much disabled due to hypoxemic respiratory failure and he does not have an oxygen mobile unit.  Advised him to make an appointment to see Dr. Christinia Gully to discuss regarding portable oxygen machine and also for pulmonary rehab.  Otherwise from cardiac standpoint he remains stable.  I spent 40 minutes with the patient and his wife discussing his pulmonary issues, recent hospitalization, encouraging him to remain abstinent from tobacco use and pulmonary toilet.  I would like to see him back in 6 months instead of an annual basis to make sure that he does not develop right-sided heart failure from cor pulmonale.  Beats are normal, blood pressure is normal.  This was a 40-minute office visit encounter with discussions regarding previously diagnosed  ascending aortic aneurysm, probably an error in measurement as multiple imaging studies have not demonstrated aneurysm.  I also reviewed his recent hospitalization records.  I also discussed with the patient regarding complete abstinence from tobacco even though he has severe end-stage COPD.  Wife is present and all questions from her answered as well.  CC: Christinia Gully, MD (Pul).    Adrian Prows, PA-C 02/04/2021, 10:29 AM Office: [phone]

## 2021-02-06 ENCOUNTER — Encounter: Payer: Self-pay | Admitting: Cardiology

## 2021-02-06 DIAGNOSIS — J449 Chronic obstructive pulmonary disease, unspecified: Secondary | ICD-10-CM | POA: Diagnosis not present

## 2021-02-08 DIAGNOSIS — J449 Chronic obstructive pulmonary disease, unspecified: Secondary | ICD-10-CM | POA: Diagnosis not present

## 2021-03-09 DIAGNOSIS — J449 Chronic obstructive pulmonary disease, unspecified: Secondary | ICD-10-CM | POA: Diagnosis not present

## 2021-03-10 DIAGNOSIS — E7800 Pure hypercholesterolemia, unspecified: Secondary | ICD-10-CM | POA: Diagnosis not present

## 2021-03-10 DIAGNOSIS — I10 Essential (primary) hypertension: Secondary | ICD-10-CM | POA: Diagnosis not present

## 2021-03-10 DIAGNOSIS — Z125 Encounter for screening for malignant neoplasm of prostate: Secondary | ICD-10-CM | POA: Diagnosis not present

## 2021-03-11 DIAGNOSIS — J449 Chronic obstructive pulmonary disease, unspecified: Secondary | ICD-10-CM | POA: Diagnosis not present

## 2021-03-16 DIAGNOSIS — Z23 Encounter for immunization: Secondary | ICD-10-CM | POA: Diagnosis not present

## 2021-03-16 DIAGNOSIS — J439 Emphysema, unspecified: Secondary | ICD-10-CM | POA: Diagnosis not present

## 2021-03-16 DIAGNOSIS — Z0000 Encounter for general adult medical examination without abnormal findings: Secondary | ICD-10-CM | POA: Diagnosis not present

## 2021-03-16 DIAGNOSIS — I340 Nonrheumatic mitral (valve) insufficiency: Secondary | ICD-10-CM | POA: Diagnosis not present

## 2021-03-16 DIAGNOSIS — I700 Atherosclerosis of aorta: Secondary | ICD-10-CM | POA: Diagnosis not present

## 2021-03-16 DIAGNOSIS — I2510 Atherosclerotic heart disease of native coronary artery without angina pectoris: Secondary | ICD-10-CM | POA: Diagnosis not present

## 2021-03-16 DIAGNOSIS — R69 Illness, unspecified: Secondary | ICD-10-CM | POA: Diagnosis not present

## 2021-03-16 DIAGNOSIS — I6529 Occlusion and stenosis of unspecified carotid artery: Secondary | ICD-10-CM | POA: Diagnosis not present

## 2021-03-16 DIAGNOSIS — E7800 Pure hypercholesterolemia, unspecified: Secondary | ICD-10-CM | POA: Diagnosis not present

## 2021-03-16 DIAGNOSIS — I10 Essential (primary) hypertension: Secondary | ICD-10-CM | POA: Diagnosis not present

## 2021-04-08 DIAGNOSIS — J449 Chronic obstructive pulmonary disease, unspecified: Secondary | ICD-10-CM | POA: Diagnosis not present

## 2021-04-10 DIAGNOSIS — J449 Chronic obstructive pulmonary disease, unspecified: Secondary | ICD-10-CM | POA: Diagnosis not present

## 2021-05-04 DIAGNOSIS — R69 Illness, unspecified: Secondary | ICD-10-CM | POA: Diagnosis not present

## 2021-05-04 DIAGNOSIS — J439 Emphysema, unspecified: Secondary | ICD-10-CM | POA: Diagnosis not present

## 2021-05-04 DIAGNOSIS — F411 Generalized anxiety disorder: Secondary | ICD-10-CM | POA: Diagnosis not present

## 2021-05-09 DIAGNOSIS — J449 Chronic obstructive pulmonary disease, unspecified: Secondary | ICD-10-CM | POA: Diagnosis not present

## 2021-05-11 DIAGNOSIS — J449 Chronic obstructive pulmonary disease, unspecified: Secondary | ICD-10-CM | POA: Diagnosis not present

## 2021-05-20 ENCOUNTER — Encounter: Payer: Self-pay | Admitting: Internal Medicine

## 2021-05-20 ENCOUNTER — Other Ambulatory Visit: Payer: Self-pay

## 2021-05-20 ENCOUNTER — Ambulatory Visit: Payer: Medicare HMO | Admitting: Internal Medicine

## 2021-05-20 DIAGNOSIS — J449 Chronic obstructive pulmonary disease, unspecified: Secondary | ICD-10-CM | POA: Diagnosis not present

## 2021-05-20 DIAGNOSIS — J9611 Chronic respiratory failure with hypoxia: Secondary | ICD-10-CM | POA: Insufficient documentation

## 2021-05-20 DIAGNOSIS — R918 Other nonspecific abnormal finding of lung field: Secondary | ICD-10-CM | POA: Diagnosis not present

## 2021-05-20 MED ORDER — PREDNISONE 10 MG PO TABS: ORAL_TABLET | ORAL | 0 refills | Status: DC

## 2021-05-20 NOTE — Assessment & Plan Note (Signed)
See admit 04/07/20  -  04/21/2020   Walked RA  2 laps @ approx 235ft each @ avg pace  stopped due to end of study,  Sob on last lap but talked incessantly during walk and sats still 94% at end  -  03/31/21  Best fit eval:  Sat > 90% on 3 Pulsed > rec refillable tanks per [facility] -  05/20/2021   Walked on 2lpm cont x 1  lap(s) =  approx 250 ft  @ moderate  pace, stopped due to sob  with lowest 02 sats 93%    Advised again: Make sure you check your oxygen saturation  AT  your highest level of activity (not after you stop)   to be sure it stays over 90% and adjust  02 flow upward to maintain this level if needed but remember to turn it back to previous settings when you stop (to conserve your supply).    Each maintenance medication was reviewed in detail including emphasizing most importantly the difference between maintenance and prns and under what circumstances the prns are to be triggered using an action plan format where appropriate.  Total time for H and P, chart review, counseling, reviewing dpi/hfa/neb/02 device(s) , directly observing portions of ambulatory 02 saturation study/ and generating customized AVS unique to this office visit / same day charting = 

## 2021-05-20 NOTE — Patient Instructions (Addendum)
We will schedule you for a high resolution CT Chest  and call you the result  Prednisone 10 mg take  4 each am x 2 days,   2 each am x 2 days,  1 each am x 2 days and stop   Ok to try albuterol 15 min before an activity (on alternating days the inhaler/ then the nebulizers)  that you know would usually make you short of breath and see if it makes any difference and if makes none then don't take albuterol after activity unless you can't catch your breath as this means it's the resting that helps, not the albuterol.  Make sure you check your oxygen saturation  AT  your highest level of activity (not after you stop)   to be sure it stays over 90% and adjust  02 flow upward to maintain this level if needed but remember to turn it back to previous settings when you stop (to conserve your supply).   Let Dr Nadara Eaton know if you still have the heart racing problem after first sure assuring both of [place] that your oxygen level is not the problem   I will find out what the 02 problem was with your walking test.   We will arrange follow up once the above tests are available

## 2021-05-20 NOTE — Assessment & Plan Note (Signed)
Quit smoking 03/2020 / [facility]  - PFT's  01/22/15 FEV1 1.17 (34 % ) ratio 0.35    p 0 prior to study with DLCO  16.89 (54%) corrects to 2.89 (63%)  for alv volume and FV curve classic concavity   -  04/21/2020:   alpha one AT phenotype  >>>  [facility]   Level 116  -  05/22/2020   Walked RA  2 laps @ approx 218ft each @ avg pace  stopped due to end of study,  Min sob with sats 92%   Mild recent flare to with abn CT chest and persistent rhonchi RUL p rx with abx? (not documented) but no purulent sputum worrisome for bronchogenic ca or bronchiectasis in that order  rec Prednisone 10 mg take  4 each am x 2 days,   2 each am x 2 days,  1 each am x 2 days and stop  hrct chest  Continue anoro and approp saba  Re SABA :  I spent extra time with pt today reviewing appropriate use of albuterol for prn use on exertion with the following points: 1) saba is for relief of sob that does not improve by walking a slower pace or resting but rather if the pt does not improve after trying this first. 2) If the pt is convinced, as many are, that saba helps recover from activity faster then it's easy to tell if this is the case by re-challenging : ie stop, take the inhaler, then p 5 minutes try the exact same activity (intensity of workload) that just caused the symptoms and see if they are substantially diminished or not after saba 3) if there is an activity that reproducibly causes the symptoms, try the saba 15 min before the activity on alternate days   If in fact the saba really does help, then fine to continue to use it prn but advised may need to look closer at the maintenance regimen being used to achieve better control of airways disease with exertion.

## 2021-05-20 NOTE — Progress Notes (Addendum)
Subjective:    Patient ID: Timothy Maldonado, male    DOB: 05-01-1951     MRN: [id number]    Brief patient profile:  31  yowm  MZ/  quit smoking July 2022 with new onset doe 2014 referred by Dr Shelia Media to pulmonary clinic 01/26/2015 with document GOLD III COPD on 01/12/15    History of Present Illness  01/26/2015 1st [facility] office visit/ Keiry Kowal   Chief Complaint  Patient presents with   Advice Only    Referred by Dr. Shelia Media for SOB, DOE, weight loss  better since starting spiriva - only sob with exertion/ ok if paces himself slowly = MMRC 2  rec Plan A = Automatic = spiriva 2 puffs each am Plan B= Backup - Only use your albuterol (proair - RED) as a rescue medication     Admit date: 04/07/2020 Discharge date: 04/08/2020 Home Health: None Equipment/Devices: Home oxygen, home nebulizer machine     HPI: Per admitting MD, Timothy Maldonado is a 70 y.o. male with medical history significant of  COPD-emphysema by CT chest and PFTs, hyperlipidemia, hypertension, atherosclerosis, and Thoracic AA who presents emergency department with shortness of breath.  At his baseline he gets SOB with exertion. For the past week he has been much worse unable to complete household tasks without resting. States that this morning he awoke and began coughing.  He states that he could not stop and then had severe difficulty breathing.  He was unable to get his oxygen saturations above 90% at home and presented to the emergency department.  He is a chronic daily smoker smoking 10-20 cig/day.  He is on albuterol and Anoro Ellipta.  Patient states that his breathing seemed a bit worse yesterday and that at times he felt "like I was mechanically breathing and had to think about each breath in and out."  He has been triple vaccinated against the coronavirus and also had his flu vaccine earlier this year.  He denies any fevers, chills, chest pain, history of CHF or volume overload.  He states that he has never been  hospitalized or even to the emergency department for his breathing before.   Hospital Course / Discharge diagnoses: COPD/emphysema -with acute exacerbation, patient wheezing on admission.  He was placed on nebulizers, steroids, he was initially requiring BiPAP but improved rapidly with treatment.  By hospital day 2, his wheezing has completely resolved, he was satting well on room air and able to ambulate and feeling back to baseline.  He does however require oxygen on discharge as he desats into the mid 80s on room air with walking.  Patient tells me this is chronic, he has checked his O2 sats at home with ambulation for a while now and he is regularly seeing sats in the low 80s.  He will be discharged on a prednisone taper, was not started on antibiotics due to not having a productive cough and chest x-ray was clear on admission.  He will be set up with home oxygen as well as a nebulizer machine at home. Essential hypertension-resume home medications on discharge Hyperlipidemia-resume home medications on discharge Tobacco use -counseled to quit     04/21/2020   ov/Atiyah Bauer re: re-establish s/p admit  maint on anoro  Chief Complaint  Patient presents with   Pulmonary Consult    Referred by Dr. Deland Pretty. Recent hospitalization 04/07/20-04/08/20 for COPD exacerbation- sent home on o2 and nebs. He has not been using the o2 consistantly.   Dyspnea: room  to room now /  Baseline and back to house 50-75 ft and flat and stops on landing  Cough: better > just clear mucus  Sleeping: on side/ flat bed  SABA use: once daily around lunch duoneb  02: not using - 02 is low 90s  Last prednisone 04/20/20  rec Plan A = Automatic = Always=    Anoro one click each am  Plan B = Backup (to supplement plan A, not to replace it) Only use your albuterol (prefer without ipatropium) nebulizer as a rescue medication  Make sure you check your oxygen saturations at highest level of activity  If can't comfortable with  cramps or breathing ok to try xanax 0.25 mg twice daily as needed      05/22/2020  f/u ov/Jacorion Klem re: MZ/ copd  Anoro  Chief Complaint  Patient presents with   Follow-up    Breathing has improved slightly since the last visit. He does not have an albuterol inhaler right now and is using his albuterol neb on average once per day.   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 He has good days and bad days. He rarely uses albuterol inhaler or neb.    Dyspnea:  better on 02  Cough: none Sleeping: ok off 02 SABA use: none 02: prn Covid status:   vax x 3  Wants to be able to travel to beach s concentrator Rec Plan A = Automatic = Always=    Anoro one click first thing in am  Plan B = Backup (to supplement plan A, not to replace it) Only use your albuterol inhaler as a rescue medication  Plan C = Crisis (instead of Plan B but only if Plan B stops working) - only use your albuterol nebulizer if you first try Plan B and it fails to help  Ok to Try albuterol 15 min before an activity (on alternating days)  that you know would make you short of breath     Make sure you check your oxygen saturation  at your highest level of activity     05/20/2021  f/u ov/Fredrica Capano re: GOLD 3  maint on anoro daily / not sure saba helping   Chief Complaint  Patient presents with    Follow-up    Has been using o2 2lpm 24/7 since July 1022 hospital stay. He is not using his albuterol inhaler or neb.   Dyspnea:  walked laps in house 400 ft 2 day prior "wore me out "  ? What was your ex 02 "don't know" "my heart was racing too fast"   Cough: not a prolbem Sleeping: L side down flat surface not a problem SABA use: confused  02:  sleeping on 2lpm/ 2lpm/ says 02 company said could not provide POC but rec 3lpm pulsed per notes  Covid status:   vax x 3    No obvious day to day or daytime variability or assoc excess/ purulent sputum or mucus plugs or hemoptysis or cp or chest tightness, subjective wheeze or overt sinus or hb symptoms.   Sleeps without nocturnal  or early am exacerbation  of respiratory  c/o's or need for noct saba. Also denies any obvious fluctuation of symptoms with weather or environmental changes or other aggravating or alleviating factors except as outlined above   No unusual exposure hx or h/o childhood pna/ asthma or knowledge of premature birth.  Current Allergies, Complete Past Medical History, Past Surgical History, Family History, and Social History were reviewed in [employer] record.  ROS  The following are not active complaints unless bolded Hoarseness, sore throat, dysphagia, dental problems, itching, sneezing,  nasal congestion or discharge of excess mucus or purulent secretions, ear ache,   fever, chills, sweats, unintended wt loss or wt gain, classically pleuritic or exertional cp,  orthopnea pnd or arm/hand swelling  or leg swelling, presyncope, palpitations, abdominal pain, anorexia, nausea, vomiting, diarrhea  or change in bowel habits or change in bladder habits, change in stools or change in urine, dysuria, hematuria,  rash, arthralgias, visual complaints, headache, numbness, weakness or ataxia or problems with walking or coordination,  change in mood or  memory.        Current Meds  Medication Sig   albuterol  (PROVENTIL) (2.5 MG/3ML) 0.083% nebulizer solution Take 3 mLs (2.5 mg total) by nebulization every 4 (four) hours as needed for wheezing or shortness of breath.   albuterol (VENTOLIN HFA) 108 (90 Base) MCG/ACT inhaler Inhale 2 puffs into the lungs every 4 (four) hours as needed for wheezing or shortness of breath.   ANORO ELLIPTA 62.5-25 MCG/INH AEPB Inhale 1 puff into the lungs daily.   aspirin 81 MG tablet Take 81 mg by mouth daily.   CRESTOR 20 MG tablet Take 20 mg by mouth daily.    ezetimibe (ZETIA) 10 MG tablet Take 10 mg by mouth daily.   hydrochlorothiazide (MICROZIDE) 12.5 MG capsule Take 12.5 mg by mouth daily.   irbesartan (AVAPRO) 150 MG tablet Take 150 mg by mouth daily after supper.    predniSONE (DELTASONE) 10 MG tablet Take  4 each am x 2 days,   2 each am x 2 days,  1 each am x 2 days and stop                .      Objective:   Physical Exam   Wt  05/20/2021        142  11/19/2020          147  05/22/2020      145  04/21/2020       141   01/26/15 135 lb 3.2 oz (61.326 kg)  12/22/08 167 lb 9.6 oz (76.023 kg)  08/11/08 178 lb 6.4 oz (80.922 kg)      Vital signs reviewed  05/20/2021  - Note at rest 02 sats  97% on 2lpm cont    General appearance:    thin easily angered / frustrated wm nad at rest   HEENT : pt wearing mask not removed for exam due to covid -19 concerns.    NECK :  without JVD/Nodes/TM/ nl carotid upstrokes bilaterally   LUNGS: no acc muscle use,  Mod barrel  contour chest wall with bilateral  Distant bs s audible wheeze and  without cough on insp or exp maneuvers and mod  Hyperresonant  to  percussion bilaterally     CV:  RRR  no s3 or murmur or increase in P2, and no edema   ABD:  soft and nontender with pos mid insp Hoover's  in the supine position. No bruits or organomegaly appreciated, bowel sounds nl  MS:     ext warm without deformities, calf tenderness, cyanosis or clubbing No obvious joint restrictions   SKIN: warm and dry without  lesions    NEURO:  alert, approp, nl sensorium with  no motor or cerebellar deficits apparent.             Assessment & Plan:

## 2021-05-26 ENCOUNTER — Ambulatory Visit: Payer: Medicare HMO | Admitting: Internal Medicine

## 2021-06-03 ENCOUNTER — Other Ambulatory Visit: Payer: Medicare HMO

## 2021-06-08 DIAGNOSIS — J449 Chronic obstructive pulmonary disease, unspecified: Secondary | ICD-10-CM | POA: Diagnosis not present

## 2021-06-10 ENCOUNTER — Other Ambulatory Visit: Payer: Medicare HMO

## 2021-06-10 DIAGNOSIS — J449 Chronic obstructive pulmonary disease, unspecified: Secondary | ICD-10-CM | POA: Diagnosis not present

## 2021-06-18 ENCOUNTER — Other Ambulatory Visit: Payer: Self-pay

## 2021-06-18 ENCOUNTER — Ambulatory Visit (INDEPENDENT_AMBULATORY_CARE_PROVIDER_SITE_OTHER)
Admission: RE | Admit: 2021-06-18 | Discharge: 2021-06-18 | Disposition: A | Discharge status: Home / Self Care | Payer: Medicare HMO | Source: Ambulatory Visit | Attending: Internal Medicine | Admitting: Internal Medicine

## 2021-06-18 ENCOUNTER — Other Ambulatory Visit: Payer: Medicare HMO

## 2021-06-18 DIAGNOSIS — Z8701 Personal history of pneumonia (recurrent): Secondary | ICD-10-CM | POA: Diagnosis not present

## 2021-06-18 DIAGNOSIS — R918 Other nonspecific abnormal finding of lung field: Secondary | ICD-10-CM

## 2021-06-18 DIAGNOSIS — J432 Centrilobular emphysema: Secondary | ICD-10-CM | POA: Diagnosis not present

## 2021-06-18 DIAGNOSIS — J479 Bronchiectasis, uncomplicated: Secondary | ICD-10-CM | POA: Diagnosis not present

## 2021-06-18 DIAGNOSIS — I2510 Atherosclerotic heart disease of native coronary artery without angina pectoris: Secondary | ICD-10-CM | POA: Diagnosis not present

## 2021-06-23 NOTE — Progress Notes (Signed)
High-resolution CT scan of the chest 06/19/2021: Aortic and three-vessel coronary calcification. Ectasia of the ascending thoracic aorta with maximum diameter of 4.1 cm stable from 12/28/2020. Severe centrilobular and paraseptal emphysema.

## 2021-06-23 NOTE — Progress Notes (Signed)
Spoke with pt and notified of results per Dr. Melvyn Novas. Pt verbalized understanding and denied any questions. Copy routed to Dr Nadyne Coombes

## 2021-07-09 DIAGNOSIS — J449 Chronic obstructive pulmonary disease, unspecified: Secondary | ICD-10-CM | POA: Diagnosis not present

## 2021-07-11 DIAGNOSIS — J449 Chronic obstructive pulmonary disease, unspecified: Secondary | ICD-10-CM | POA: Diagnosis not present

## 2021-08-09 ENCOUNTER — Other Ambulatory Visit: Payer: Self-pay

## 2021-08-09 ENCOUNTER — Encounter: Payer: Self-pay | Admitting: Cardiology

## 2021-08-09 ENCOUNTER — Ambulatory Visit: Payer: Medicare HMO | Admitting: Cardiology

## 2021-08-09 VITALS — BP 118/83 | HR 65 | Temp 97.8°F | Resp 16 | Ht 69.0 in | Wt 140.0 lb

## 2021-08-09 DIAGNOSIS — I2510 Atherosclerotic heart disease of native coronary artery without angina pectoris: Secondary | ICD-10-CM

## 2021-08-09 DIAGNOSIS — I77810 Thoracic aortic ectasia: Secondary | ICD-10-CM | POA: Diagnosis not present

## 2021-08-09 DIAGNOSIS — E7800 Pure hypercholesterolemia, unspecified: Secondary | ICD-10-CM

## 2021-08-09 DIAGNOSIS — I340 Nonrheumatic mitral (valve) insufficiency: Secondary | ICD-10-CM | POA: Diagnosis not present

## 2021-08-09 DIAGNOSIS — R0609 Other forms of dyspnea: Secondary | ICD-10-CM

## 2021-08-09 DIAGNOSIS — J432 Centrilobular emphysema: Secondary | ICD-10-CM | POA: Diagnosis not present

## 2021-08-09 DIAGNOSIS — J449 Chronic obstructive pulmonary disease, unspecified: Secondary | ICD-10-CM | POA: Diagnosis not present

## 2021-08-09 NOTE — Progress Notes (Signed)
Primary Physician/Referring:  Deland Pretty, MD  Patient ID: Timothy Maldonado, male    DOB: 04/08/1951, 71 y.o.   MRN: [id number]  Chief Complaint  Patient presents with   Shortness of Breath   Follow-up    6 months   HPI:    Timothy Maldonado  is a 71 y.o. male  with COPD with centrilobular emphysema, chronic shortness of breath and 4.1 cm Asc Aortic ectasia, primary hypertension presents for annual visit.  He completely quit smoking tobacco/cigarettes in July 2022.  He is accompanied by his wife, they had multiple questions regarding the CT scan that was recently performed and also wanted to discuss regarding marked dyspnea on exertion and elevated heart rate with exertion activity. He has not had any chest pain, leg edema.    Past Medical History:  Diagnosis Date   Bursitis    Carotid atherosclerosis    COPD (chronic obstructive pulmonary disease) (HCC)    Hyperlipidemia, group A 07/21/2018   Libido, decreased    Low back pain    Plantar wart    Screening for abdominal aortic aneurysm (AAA) performed 07/21/2018   Abdominal aortic duplex 01/04/2018: The maximum aorta diameter is 2.48 cm (prox).  Ectasia of the abdominal aorta measuring 2.48 x 2.4 x 2.47 cm is seen.  Focal plaque observed in the proximal, mid and distal aorta. Peak systolic velocities in the left internal iliac artery are mildly increased suggests <50% stenosis. Enlarged inferior vena cava with normal respiratory variation suggests elevated    History reviewed. No pertinent surgical history.   Social History   Tobacco Use   Smoking status: Former    Packs/day: 1.00    Years: 48.00    Pack years: 48.00    Types: Cigarettes    Quit date: 03/10/2020    Years since quitting: 1.4   Smokeless tobacco: Never  Substance Use Topics   Alcohol use: Yes    Alcohol/week: 0.0 standard drinks    Comment: occasionally   Marital Status: Married   ROS  Review of Systems  Cardiovascular:  Positive for dyspnea on  exertion. Negative for chest pain, leg swelling, palpitations and syncope.  Respiratory:  Positive for cough and sputum production.   Gastrointestinal:  Negative for melena.  Objective  Blood pressure 118/83, pulse 65, temperature 97.8 F (36.6 C), temperature source Temporal, resp. rate 16, height 5' 9" (1.753 m), weight 140 lb (63.5 kg), SpO2 (!) 87 %. Body mass index is 20.67 kg/m.  Vitals with BMI 08/09/2021 05/20/2021 02/04/2021  Height 5' 9" 5' 9" -  Weight 140 lbs 142 lbs -  BMI 00.17 49.44 -  Systolic 967 591 638  Diastolic 83 82 89  Pulse 65 97 86      Physical Exam Constitutional:      Comments: lean  Neck:     Vascular: No JVD.  Cardiovascular:     Rate and Rhythm: Normal rate and regular rhythm.     Pulses: Intact distal pulses.          Carotid pulses are 2+ on the right side and 2+ on the left side.      Radial pulses are 2+ on the right side and 2+ on the left side.       Femoral pulses are 1+ on the right side and 1+ on the left side.      Popliteal pulses are 1+ on the right side and 1+ on the left side.  Dorsalis pedis pulses are 0 on the right side and 0 on the left side.       Posterior tibial pulses are 0 on the right side and 1+ on the left side.     Heart sounds: Normal heart sounds. No murmur heard.   No gallop.     Comments: No edema. Pulmonary:     Effort: Pulmonary effort is normal. Tachypnea present. No respiratory distress.     Breath sounds: Rales (bilateral diffuse extensive) present.     Comments: Barrel shapedd chest. Prolonged expiration Abdominal:     General: Bowel sounds are normal.     Palpations: Abdomen is soft.  Musculoskeletal:     Right lower leg: No edema.     Left lower leg: No edema.  Skin:    Capillary Refill: Capillary refill takes less than 2 seconds.   Radiology: No results found.  Laboratory examination:   External Labs:   Labs 03/10/2021:  Total cholesterol 142, triglycerides 88, HDL 50, LDL 74.  Non-HDL  cholesterol 92.  Hb 14.8/HCT 45.1, platelets 275.  Normal indicis.  Sodium 140, potassium 4.4, BUN 16, creatinine 0.97, EGFR 79 mm, LFTs normal.  Medications   Current Outpatient Medications:    albuterol (PROVENTIL) (2.5 MG/3ML) 0.083% nebulizer solution, Take 3 mLs (2.5 mg total) by nebulization every 4 (four) hours as needed for wheezing or shortness of breath., Disp: 75 mL, Rfl: 12   albuterol (VENTOLIN HFA) 108 (90 Base) MCG/ACT inhaler, Inhale 2 puffs into the lungs every 4 (four) hours as needed for wheezing or shortness of breath., Disp: 18 g, Rfl: 11   ALPRAZolam (XANAX) 0.25 MG tablet, Take 0.25 mg by mouth 2 (two) times daily as needed., Disp: , Rfl:    ANORO ELLIPTA 62.5-25 MCG/INH AEPB, Inhale 1 puff into the lungs daily., Disp: , Rfl:    aspirin 81 MG tablet, Take 81 mg by mouth daily., Disp: , Rfl:    CRESTOR 20 MG tablet, Take 20 mg by mouth daily. , Disp: , Rfl:    ezetimibe (ZETIA) 10 MG tablet, Take 10 mg by mouth daily., Disp: , Rfl:    hydrochlorothiazide (MICROZIDE) 12.5 MG capsule, Take 12.5 mg by mouth daily., Disp: , Rfl:    irbesartan (AVAPRO) 150 MG tablet, Take 150 mg by mouth daily after supper. , Disp: , Rfl:    Radiology:   High-resolution CT scan of the chest 06/19/2021: Aortic and three-vessel coronary calcification. Ectasia of the ascending thoracic aorta with maximum diameter of 4.1 cm stable from 12/28/2020. Severe centrilobular and paraseptal emphysema. Cardiac Studies:   Nuclear stress test   Lexiscan myoview stress test 11/03/2017: 1. The resting electrocardiogram demonstrated normal sinus rhythm, normal resting conduction, no resting arrhythmias and normal rest repolarization. Stress EKG is non-diagnostic for ischemia as it a pharmacologic stress using Lexiscan. Patient developed frequent PVC in bigeminal pattern in recovery. Stress symptoms included dizziness. 2. Myocardial perfusion imaging is normal. Overall left ventricular systolic  function was normal without regional wall motion abnormalities. The left ventricular ejection fraction was 66%. This is an intermediate risk study, clinical correlation recommended.   Abdominal aortic duplex 01/04/2018: The maximum aorta diameter is 2.48 cm (prox). Ectasia of the abdominal aorta measuring 2.48 x 2.4 x 2.47 cm is seen. Focal plaque observed in the proximal, mid and distal aorta. Peak systolic velocities in the left internal iliac artery are mildly increased suggests <50% stenosis. Enlarged inferior vena cava with normal respiratory variation suggests elevated central venous pressure.  PCV ECHOCARDIOGRAM COMPLETE 01/75/1025 Normal LV systolic function with EF 57%. Left ventricle cavity is normal in size. Normal left ventricular wall thickness. Normal global wall motion. Indeterminate diastolic filling pattern. Calculated EF 57%. MV appears to be normal with no significant myxomatous degeneration. Mild prolapse of the mitral valve leaflets with mostly posteriorly directed moderate (Grade III) mitral regurgitation. Structurally normal tricuspid valve.  Mild tricuspid regurgitation. No evidence of pulmonary hypertension. No significant change from 01/15/2019.  EKG:  EKG 08/09/2021: Normal sinus rhythm with rate of 99 bpm, normal axis, right bundle branch block.  No evidence of ischemia, no significant change from 02/04/2021.  Assessment     ICD-10-CM   1. Coronary artery calcification seen on CAT scan  I25.10     2. Moderate mitral regurgitation  I34.0 EKG 12-Lead    3. Dyspnea on exertion  R06.09     4. Centrilobular emphysema ([place])  J43.2     5. Thoracic aortic ectasia (HCC)  I77.810     6. Hypercholesteremia  E78.00       Recommendations:   Timothy Maldonado  is a 71 y.o. male  with COPD with centrilobular emphysema, chronic shortness of breath and 4.1 cm Asc Aortic ectasia, primary hypertension presents for annual visit.  He completely quit smoking tobacco/cigarettes  in July 2022.  He is accompanied by his wife, they had multiple questions regarding the CT scan that was recently performed and also wanted to discuss regarding marked dyspnea on exertion and elevated heart rate with exertion activity.  I have reviewed the echocardiogram with the patient, advised him that the mitral regurgitation is not the etiology for his dyspnea.  There has been significant progression of emphysema by CT scan to severe centrilobular and paraseptal emphysema leading to hypoxemic respiratory failure that is chronic.  From aortic ectasia standpoint, the measurements are remained stable at 4.1 cm, hence continue present management.  Blood pressure is well controlled and lipids are at goal, I reviewed his external labs.  With regard to chronic hypoxemic respiratory failure, he is requesting a portable oxygen pump, Dr. Gustavus Bryant office has been working on this, I will follow-up on this.  He would greatly benefit from a portable machine, as he is now homebound without oxygen he cannot go anywhere and he would like to go to his beach house this summer.  No clinical evidence of heart failure, no change in vascular exam, he does have peripheral artery disease but asymptomatic, capillary refill is <2 seconds.  He has remained abstinent from tobacco and I congratulated him.  I will see him back on an annual basis.  This was a 40-minute office visit encounter.    CC: Christinia Gully, MD (Pul).    Adrian Prows, PA-C 08/09/2021, 2:10 PM Office: [phone]

## 2021-08-10 DIAGNOSIS — J449 Chronic obstructive pulmonary disease, unspecified: Secondary | ICD-10-CM | POA: Diagnosis not present

## 2021-08-26 ENCOUNTER — Telehealth: Payer: Self-pay | Admitting: Internal Medicine

## 2021-08-27 NOTE — Telephone Encounter (Signed)
Called and spoke with patient, he states he went to the hospital back in 2021 and sent home with oxygen.  He states he was never sent home with the simply go mini.  He was given a concentrator with a refillable system.  He did the walk test and qualified for the POC.  He was then told that he had been on oxygen too long and could not get the POC.  He saw Dr. Melvyn Novas back in December of 2022 and they had an issue during the visit and Dr. Melvyn Novas said he could not do anything about the POC then, but would address it at another time.  He wants to know if he is going to get a POC or whether he needs to buy one.  He want to be able to travel, the bottles only last 4 hours.  He wants to get back to his life and be able to work part time.  We discussed sending an order to [employer] for a POC versus sending an order to [employer] for a POC.  I sent him a pamphlet from [employer] with the POCs and the stationary concentrator to review and determine whether he just wants a POC or whether he wants to get a POC and the stationary concentrator.  He will review the brochure and call the office at the main number and let us know what he decides to do.  I let him know I will leave the note open so when he calls back we will know why he is calling back.  He verbalized understanding. ?

## 2021-09-06 DIAGNOSIS — J449 Chronic obstructive pulmonary disease, unspecified: Secondary | ICD-10-CM | POA: Diagnosis not present

## 2021-09-08 DIAGNOSIS — J449 Chronic obstructive pulmonary disease, unspecified: Secondary | ICD-10-CM | POA: Diagnosis not present

## 2021-09-10 DIAGNOSIS — I10 Essential (primary) hypertension: Secondary | ICD-10-CM | POA: Diagnosis not present

## 2021-09-10 DIAGNOSIS — I340 Nonrheumatic mitral (valve) insufficiency: Secondary | ICD-10-CM | POA: Diagnosis not present

## 2021-09-10 DIAGNOSIS — E7800 Pure hypercholesterolemia, unspecified: Secondary | ICD-10-CM | POA: Diagnosis not present

## 2021-09-10 DIAGNOSIS — I6529 Occlusion and stenosis of unspecified carotid artery: Secondary | ICD-10-CM | POA: Diagnosis not present

## 2021-10-07 DIAGNOSIS — J449 Chronic obstructive pulmonary disease, unspecified: Secondary | ICD-10-CM | POA: Diagnosis not present

## 2021-10-09 DIAGNOSIS — J449 Chronic obstructive pulmonary disease, unspecified: Secondary | ICD-10-CM | POA: Diagnosis not present

## 2021-10-12 DIAGNOSIS — J439 Emphysema, unspecified: Secondary | ICD-10-CM | POA: Diagnosis not present

## 2021-10-12 DIAGNOSIS — I10 Essential (primary) hypertension: Secondary | ICD-10-CM | POA: Diagnosis not present

## 2021-11-06 DIAGNOSIS — J449 Chronic obstructive pulmonary disease, unspecified: Secondary | ICD-10-CM | POA: Diagnosis not present

## 2021-11-08 DIAGNOSIS — J449 Chronic obstructive pulmonary disease, unspecified: Secondary | ICD-10-CM | POA: Diagnosis not present

## 2021-12-07 DIAGNOSIS — J449 Chronic obstructive pulmonary disease, unspecified: Secondary | ICD-10-CM | POA: Diagnosis not present

## 2021-12-09 DIAGNOSIS — J449 Chronic obstructive pulmonary disease, unspecified: Secondary | ICD-10-CM | POA: Diagnosis not present

## 2022-01-06 DIAGNOSIS — J449 Chronic obstructive pulmonary disease, unspecified: Secondary | ICD-10-CM | POA: Diagnosis not present

## 2022-01-08 DIAGNOSIS — J449 Chronic obstructive pulmonary disease, unspecified: Secondary | ICD-10-CM | POA: Diagnosis not present

## 2022-01-26 ENCOUNTER — Telehealth: Payer: Self-pay | Admitting: Internal Medicine

## 2022-01-26 NOTE — Telephone Encounter (Signed)
Fine with me

## 2022-01-26 NOTE — Telephone Encounter (Signed)
Patient is wanting to switch providers from Dr Sherene Sires to Dr [facility]  Dr Sherene Sires are you ok with this change  Dr Judeth Horn are you ok with this change  Please advise

## 2022-02-02 NOTE — Telephone Encounter (Signed)
Called patient and got him scheduled as a new patient for  September 19 11am Dr [facility]  Patient needs a walk in office. We need to send to [employer].   Nothing further needed

## 2022-02-06 DIAGNOSIS — J449 Chronic obstructive pulmonary disease, unspecified: Secondary | ICD-10-CM | POA: Diagnosis not present

## 2022-02-08 DIAGNOSIS — J449 Chronic obstructive pulmonary disease, unspecified: Secondary | ICD-10-CM | POA: Diagnosis not present

## 2022-03-01 ENCOUNTER — Encounter: Payer: Self-pay | Admitting: Pulmonary Disease

## 2022-03-01 ENCOUNTER — Ambulatory Visit: Payer: Medicare HMO | Admitting: Pulmonary Disease

## 2022-03-01 VITALS — BP 136/70 | HR 107 | Ht 70.0 in | Wt 143.0 lb

## 2022-03-01 DIAGNOSIS — J9611 Chronic respiratory failure with hypoxia: Secondary | ICD-10-CM

## 2022-03-01 MED ORDER — BEVESPI AEROSPHERE 9-4.8 MCG/ACT IN AERO: 2.0000 | INHALATION_SPRAY | Freq: Two times a day (BID) | RESPIRATORY_TRACT | 3 refills | Status: DC

## 2022-03-01 NOTE — Patient Instructions (Addendum)
Nice to meet you  We will walk today see if you qualify your insurance for portable oxygen concentrator.  If so we will order this.  We will send this to [employer]  Stop Anoro, use Bevespi instead.  Use Bevespi 2 puffs twice a day every day.  See if a change in the medication will help open up the airways and help with some of your shortness of breath particular with exertion.  If there is any issues with cost please let me know.  If this is not particularly helpful we can always try something different in the future.  We may need to consider using nebulized versions of these medications.  Continue to use albuterol nebulized and inhaler as needed for your episodes of shortness of breath or wheezing.  Return to clinic in 3 months or sooner as needed with Dr. Silas Flood

## 2022-03-01 NOTE — Progress Notes (Signed)
@Patient  ID: , male    DOB: 04-Sep-1950, 71 y.o.   MRN: [id number]  Chief Complaint  Patient presents with   New Patient (Initial Visit)    Pt is a new pt from [facility]> Pt was being seen for pulm infiltrates. Pt is on albuterol as needed. No daily inhalers. Pt is on 2L of oxygen. Did walk test in the past with [facility] last year.     Referring provider: [id number], MD  HPI:   71 y.o. man whom I am seeing to establish care for severe COPD and chronic hypoxemic respiratory failure related to emphysema/COPD. Most recent pulmonary note from Dr. 66 x 2 reviewed.  ED note 12/2020 reviewed.  Discharge summary 03/2020 reviewed.  Patient wanted history of tobacco abuse.  Now in remission for over a year.  He was congratulated for this.  In the summer 2022 was admitted with hypoxic respiratory failure.  Placed on oxygen at that time.  Has continued since then.  In the interim has noted oxygen desaturation at night so using 2 L at night.  He was again hospitalized 12/2021 at least once daily.  At that time chest imaging consistent with pneumonia.  He was treated for pneumonia with antibiotics.    He has been on Anoro therapy.  Does not show much is helped.  He has frequent exacerbations or attacks of shortness of breath.  Cannot use albuterol HFA effectively at this time.  In the past he is use the nebulizer with some mild improvement.  He states currently his nebulizer machine is in the closet.  Has not used in some time.  He does find Xanax to be particular beneficial.  He admits that this drives a lot of situational anxiety and the Xanax helps.  Reviewed most recent CT scans 12/2020 on my review and interpretation reveals significant diffuse emphysema as well as scattered inflammatory nodules changes particularly in the right middle lobe.  Repeat high-res CT scan 06/2021 on my review and interpretation shows resolution of these inflammatory or nodular changes with persistence and consistent  diffuse significant emphysematous changes with hyperinflation.  He is frustrated with oxygen company.  Trying to get portable oxygen concentrator but has been unable to date.   Questionaires / Pulmonary Flowsheets:   ACT:      No data to display          MMRC:     No data to display          Epworth:      No data to display          Tests:   FENO:  No results found for: "NITRICOXIDE"  PFT:    Latest Ref Rng & Units 01/12/2015   11:01 AM  PFT Results  FVC-Pre L 3.32   FVC-Predicted Pre % 73   Pre FEV1/FVC % % 35   FEV1-Pre L 1.17   FEV1-Predicted Pre % 34   DLCO uncorrected ml/min/mmHg 16.89   DLCO UNC% % 54   DLVA Predicted % 63   TLC L 7.20   TLC % Predicted % 105   RV % Predicted % 143   Personally reviewed and interpreted as spirometry consistent with severe fixed obstruction with FEV1 34% predicted, lung volumes consistent with air trapping, DLCO moderately reduced.  WALK:     05/20/2021   12:56 PM 05/22/2020   11:40 AM 04/21/2020   11:47 AM  SIX MIN WALK  Supplimental Oxygen during Test? (L/min) Yes No No  O2 Flow Rate 2 L/min    Type Continuous    Tech Comments: average pace/stopped once to rest due to SOB/stopped due to SOB/lmr average pace/min SOB//lmr average pace/SOB last lap//lmr    Imaging: Personally reviewed and as per EMR No results found.  Lab Results: Personally reviewed CBC    Component Value Date/Time   WBC 16.7 (H) 12/28/2020 1512   RBC 5.68 12/28/2020 1512   HGB 17.1 (H) 12/28/2020 1512   HCT 51.9 12/28/2020 1512   PLT 238 12/28/2020 1512   MCV 91.4 12/28/2020 1512   MCH 30.1 12/28/2020 1512   MCHC 32.9 12/28/2020 1512   RDW 14.0 12/28/2020 1512   LYMPHSABS 3.0 04/21/2020 1143   MONOABS 1.7 (H) 04/21/2020 1143   EOSABS 0.2 04/21/2020 1143   BASOSABS 0.1 04/21/2020 1143    BMET    Component Value Date/Time   NA 135 12/28/2020 1512   K 4.3 12/28/2020 1512   CL 99 12/28/2020 1512   CO2 26 12/28/2020 1512    GLUCOSE 106 (H) 12/28/2020 1512   BUN 19 12/28/2020 1512   CREATININE 0.87 12/28/2020 1512   CALCIUM 9.2 12/28/2020 1512   GFRNONAA >60 12/28/2020 1512   GFRAA 129 08/11/2008 1122    BNP    Component Value Date/Time   BNP 71.1 12/28/2020 1512    ProBNP No results found for: "PROBNP"  Specialty Problems       Pulmonary Problems   COUGH    Qualifier: Diagnosis of  By: Drue Novel MD, Nolon Rod       COPD  GOLD III/ MZ    Followed in Pulmonary clinic/ [facility]/ Wert Quit smoking 03/2020 / MZ  - PFT's  01/22/15 FEV1 1.17 (34 % ) ratio 0.35    p 0 prior to study with DLCO  16.89 (54%) corrects to 2.89 (63%)  for alv volume and FV curve classic concavity   -  04/21/2020:   alpha one AT phenotype  >>>  MZ   Level 116  -  05/22/2020   Walked RA  2 laps @ approx 272ft each @ avg pace  stopped due to end of study,  Min sob with sats 92%       Respiratory failure, acute (HCC)    See admit 04/07/20  -  04/21/2020   Walked RA  2 laps @ approx 274ft each @ avg pace  stopped due to end of study,  Sob on last lap but talked incessantly during walk and sats still 94% at end  -  03/31/21  Best fit eval:  Sat > 90% on 3 Pulsed > rec refillable tanks per [facility]      COPD exacerbation (HCC)   Dyspnea on exertion   Chronic respiratory failure with hypoxia (HCC)    See admit 04/07/20  -  04/21/2020   Walked RA  2 laps @ approx 217ft each @ avg pace  stopped due to end of study,  Sob on last lap but talked incessantly during walk and sats still 94% at end  -  03/31/21  Best fit eval:  Sat > 90% on 3 Pulsed > rec refillable tanks per [facility] -  05/20/2021   Walked on 2lpm cont x 1  lap(s) =  approx 250 ft  @ moderate  pace, stopped due to sob  with lowest 02 sats 93%        Pulmonary infiltrates    CT chest 06/18/21 1. No findings to suggest interstitial lung  disease. 2. Diffuse bronchial wall thickening with severe centrilobular and paraseptal emphysema; imaging  findings suggestive of underlying COPD. 3. Aortic atherosclerosis, in addition to left main and three-vessel coronary artery disease 4. Ectasia of ascending thoracic aorta (4.1 cm in diameter).  5. There are calcifications of the aortic valve. Echocardiographic correlation for evaluation of potential valvular dysfunction may be warranted if clinically indicated.>  3/4/5 referred to [facility], his cardiologist       Centrilobular emphysema ([place])    Allergies  Allergen Reactions   Sulfonamide Derivatives     Childhood allergy    Immunization History  Administered Date(s) Administered   Influenza, Quadrivalent, Recombinant, Inj, Pf 03/03/2017, 02/09/2018, 02/20/2019, 03/03/2020, 03/16/2021   Influenza-Unspecified 03/07/2012, 04/01/2014, 02/25/2015, 04/06/2016, 03/03/2020   PFIZER(Purple Top)SARS-COV-2 Vaccination 07/28/2019, 08/20/2019, 03/28/2020   Pneumococcal Conjugate-13 01/28/2016   Pneumococcal Polysaccharide-23 11/27/2013   Td 01/01/2007   Tdap 08/31/2011   Zoster, Live 01/12/2015    Past Medical History:  Diagnosis Date   Bursitis    Carotid atherosclerosis    COPD (chronic obstructive pulmonary disease) ([place])    Hyperlipidemia, group A 07/21/2018   Libido, decreased    Low back pain    Plantar wart    Screening for abdominal aortic aneurysm (AAA) performed 07/21/2018   Abdominal aortic duplex 01/04/2018: The maximum aorta diameter is 2.48 cm (prox).  Ectasia of the abdominal aorta measuring 2.48 x 2.4 x 2.47 cm is seen.  Focal plaque observed in the proximal, mid and distal aorta. Peak systolic velocities in the left internal iliac artery are mildly increased suggests <50% stenosis. Enlarged inferior vena cava with normal respiratory variation suggests elevated     Tobacco History: Social History   Tobacco Use  Smoking Status Former   Packs/day: 1.00   Years: 48.00   Total pack years: 48.00   Types: Cigarettes   Quit date: 03/10/2020   Years since quitting: 1.9   Smokeless Tobacco Never   Counseling given: Not Answered   Continue to not smoke  Outpatient Encounter Medications as of 03/01/2022  Medication Sig   albuterol (PROVENTIL) (2.5 MG/3ML) 0.083% nebulizer solution Take 3 mLs (2.5 mg total) by nebulization every 4 (four) hours as needed for wheezing or shortness of breath.   albuterol (VENTOLIN HFA) 108 (90 Base) MCG/ACT inhaler Inhale 2 puffs into the lungs every 4 (four) hours as needed for wheezing or shortness of breath.   ALPRAZolam (XANAX) 0.25 MG tablet Take 0.25 mg by mouth 2 (two) times daily as needed.   aspirin 81 MG tablet Take 81 mg by mouth daily.   CRESTOR 20 MG tablet Take 20 mg by mouth daily.    ezetimibe (ZETIA) 10 MG tablet Take 10 mg by mouth daily.   Glycopyrrolate-Formoterol (BEVESPI AEROSPHERE) 9-4.8 MCG/ACT AERO Inhale 2 puffs into the lungs 2 (two) times daily.   hydrochlorothiazide (MICROZIDE) 12.5 MG capsule Take 12.5 mg by mouth daily.   irbesartan (AVAPRO) 150 MG tablet Take 150 mg by mouth daily after supper.    [DISCONTINUED] ANORO ELLIPTA 62.5-25 MCG/INH AEPB Inhale 1 puff into the lungs daily.   No facility-administered encounter medications on file as of 03/01/2022.     Review of Systems  Review of Systems  No chest pain with exertion.  No orthopnea or PND.  No lower extremity swelling.  Comprehensive review of systems otherwise negative. Physical Exam  BP 136/70 (BP Location: Right Arm, Patient Position: Sitting, Cuff Size: Normal)   Pulse (!) 107   Ht 5\' 10"  (1.778  m)   Wt 143 lb (64.9 kg)   SpO2 98%   BMI 20.52 kg/m   Wt Readings from Last 5 Encounters:  03/01/22 143 lb (64.9 kg)  08/09/21 140 lb (63.5 kg)  05/20/21 142 lb (64.4 kg)  02/04/21 141 lb 12.8 oz (64.3 kg)  12/28/20 140 lb (63.5 kg)    BMI Readings from Last 5 Encounters:  03/01/22 20.52 kg/m  08/09/21 20.67 kg/m  05/20/21 20.97 kg/m  02/04/21 20.35 kg/m  12/28/20 20.09 kg/m     Physical Exam General: Thin,  sitting in chair Eyes: EOMI, no icterus Neck: Supple, no JVP Pulmonary: Distant, mild wheeze throughout Cardiovascular: Warm, no edema Abdomen: Nondistended, bowel sounds present MSK: No synovitis, no joint effusion Neuro: Normal gait, no weakness Psych: Normal mood, full affect  Assessment & Plan:   Severe COPD: Based on PFTs 2016.  With hyperinflation.  Anoro does not seem to be helpful.  Trial Bevespi.  If not improving would strongly recommend nebulized LAMA/LABA therapy given the severe nature of his airflow restriction.  Recommended using albuterol nebs at home for rescue and continue HFA use when outside the home.  Chronic hypoxemic respiratory failure: In setting of emphysema, COPD.  Counseled this will not improve and he will need oxygen for the rest of his life.  He qualified for POC based on walk today.  New order sent to DME company, [employer].  He was encouraged to keep his oxygen saturation 88% or higher.  He is to check with exertion as well as at rest.  Increase O2 flow rate as needed and let me know if oxygen needs are increasing.  Dyspnea on exertion: Likely multifactorial related to severe COPD, valvular disease, deconditioning.  Consider pulmonary rehab in the future.   Return in about 3 months (around 05/31/2022).   Karren BurlyMatthew R Nox Talent, MD 03/01/2022   This appointment required 63 minutes of patient care (this includes precharting, chart review, review of results, face-to-face care, etc.).

## 2022-03-04 DIAGNOSIS — J9611 Chronic respiratory failure with hypoxia: Secondary | ICD-10-CM | POA: Diagnosis not present

## 2022-03-15 ENCOUNTER — Encounter (HOSPITAL_BASED_OUTPATIENT_CLINIC_OR_DEPARTMENT_OTHER): Payer: Self-pay | Admitting: Pediatrics

## 2022-03-15 ENCOUNTER — Emergency Department (HOSPITAL_BASED_OUTPATIENT_CLINIC_OR_DEPARTMENT_OTHER): Payer: Medicare HMO

## 2022-03-15 ENCOUNTER — Emergency Department (HOSPITAL_BASED_OUTPATIENT_CLINIC_OR_DEPARTMENT_OTHER)
Admission: EM | Admit: 2022-03-15 | Discharge: 2022-03-15 | Disposition: A | Discharge status: Home / Self Care | Payer: Medicare HMO | Attending: Emergency Medicine | Admitting: Emergency Medicine

## 2022-03-15 ENCOUNTER — Other Ambulatory Visit (HOSPITAL_BASED_OUTPATIENT_CLINIC_OR_DEPARTMENT_OTHER): Payer: Self-pay

## 2022-03-15 ENCOUNTER — Other Ambulatory Visit: Payer: Self-pay

## 2022-03-15 DIAGNOSIS — Z7951 Long term (current) use of inhaled steroids: Secondary | ICD-10-CM | POA: Insufficient documentation

## 2022-03-15 DIAGNOSIS — J441 Chronic obstructive pulmonary disease with (acute) exacerbation: Secondary | ICD-10-CM

## 2022-03-15 DIAGNOSIS — J439 Emphysema, unspecified: Secondary | ICD-10-CM | POA: Diagnosis not present

## 2022-03-15 DIAGNOSIS — Z7982 Long term (current) use of aspirin: Secondary | ICD-10-CM | POA: Insufficient documentation

## 2022-03-15 DIAGNOSIS — D72829 Elevated white blood cell count, unspecified: Secondary | ICD-10-CM | POA: Insufficient documentation

## 2022-03-15 DIAGNOSIS — R000 Tachycardia, unspecified: Secondary | ICD-10-CM | POA: Diagnosis not present

## 2022-03-15 DIAGNOSIS — Z20822 Contact with and (suspected) exposure to covid-19: Secondary | ICD-10-CM | POA: Diagnosis not present

## 2022-03-15 DIAGNOSIS — R0602 Shortness of breath: Secondary | ICD-10-CM | POA: Diagnosis present

## 2022-03-15 LAB — CBC WITH DIFFERENTIAL/PLATELET
Abs Immature Granulocytes: 0.03 10*3/uL (ref 0.00–0.07)
Basophils Absolute: 0.1 10*3/uL (ref 0.0–0.1)
Basophils Relative: 1 %
Eosinophils Absolute: 0.4 10*3/uL (ref 0.0–0.5)
Eosinophils Relative: 4 %
HCT: 43.7 % (ref 39.0–52.0)
Hemoglobin: 14.2 g/dL (ref 13.0–17.0)
Immature Granulocytes: 0 %
Lymphocytes Relative: 11 %
Lymphs Abs: 1.3 10*3/uL (ref 0.7–4.0)
MCH: 29.6 pg (ref 26.0–34.0)
MCHC: 32.5 g/dL (ref 30.0–36.0)
MCV: 91.2 fL (ref 80.0–100.0)
Monocytes Absolute: 0.8 10*3/uL (ref 0.1–1.0)
Monocytes Relative: 7 %
Neutro Abs: 9.3 10*3/uL — ABNORMAL HIGH (ref 1.7–7.7)
Neutrophils Relative %: 77 %
Platelets: 252 10*3/uL (ref 150–400)
RBC: 4.79 MIL/uL (ref 4.22–5.81)
RDW: 13.6 % (ref 11.5–15.5)
WBC: 11.9 10*3/uL — ABNORMAL HIGH (ref 4.0–10.5)
nRBC: 0 % (ref 0.0–0.2)

## 2022-03-15 LAB — BASIC METABOLIC PANEL
Anion gap: 8 (ref 5–15)
BUN: 15 mg/dL (ref 8–23)
CO2: 28 mmol/L (ref 22–32)
Calcium: 8.9 mg/dL (ref 8.9–10.3)
Chloride: 103 mmol/L (ref 98–111)
Creatinine, Ser: 0.8 mg/dL (ref 0.61–1.24)
GFR, Estimated: >60 mL/min (ref >60)
Glucose, Bld: 118 mg/dL — ABNORMAL HIGH (ref 70–99)
Potassium: 4.1 mmol/L (ref 3.5–5.1)
Sodium: 139 mmol/L (ref 135–145)

## 2022-03-15 LAB — TROPONIN I (HIGH SENSITIVITY): Troponin I (High Sensitivity): 14 ng/L (ref <18)

## 2022-03-15 LAB — BRAIN NATRIURETIC PEPTIDE: B Natriuretic Peptide: 87.2 pg/mL (ref 0.0–100.0)

## 2022-03-15 LAB — SARS CORONAVIRUS 2 BY RT PCR: SARS Coronavirus 2 by RT PCR: NEGATIVE

## 2022-03-15 MED ORDER — PREDNISONE 20 MG PO TABS
60.0000 mg | ORAL_TABLET | Freq: Every day | ORAL | 0 refills | Status: AC
  Filled 2022-03-15: qty 15, 5d supply, fill #0

## 2022-03-15 MED ORDER — AZITHROMYCIN 250 MG PO TABS
250.0000 mg | ORAL_TABLET | Freq: Every day | ORAL | 0 refills | Status: DC
  Filled 2022-03-15: qty 6, 5d supply, fill #0

## 2022-03-15 MED ORDER — METHYLPREDNISOLONE SODIUM SUCC 125 MG IJ SOLR
125.0000 mg | Freq: Once | INTRAMUSCULAR | Status: AC
  Administered 2022-03-15: 125 mg via INTRAVENOUS
  Filled 2022-03-15: qty 2

## 2022-03-15 MED ORDER — IPRATROPIUM-ALBUTEROL 0.5-2.5 (3) MG/3ML IN SOLN
3.0000 mL | RESPIRATORY_TRACT | Status: DC | PRN
  Administered 2022-03-15: 3 mL via RESPIRATORY_TRACT
  Filled 2022-03-15: qty 3

## 2022-03-15 MED ORDER — IPRATROPIUM-ALBUTEROL 0.5-2.5 (3) MG/3ML IN SOLN
3.0000 mL | Freq: Once | RESPIRATORY_TRACT | Status: AC
  Administered 2022-03-15: 3 mL via RESPIRATORY_TRACT
  Filled 2022-03-15: qty 3

## 2022-03-15 NOTE — ED Triage Notes (Signed)
C/O difficulty breathing and shortness of breath since Friday; on 2-3L supplemental at home.

## 2022-03-15 NOTE — Discharge Instructions (Signed)
Take next dose of steroid tomorrow.  Take antibiotic when you get home today.  Recommend breathing treatments at least every 4 hours for the next 24 hours and then as needed.  Please return if symptoms worsen as discussed.

## 2022-03-15 NOTE — ED Provider Notes (Signed)
[place] EMERGENCY DEPARTMENT Provider Note   CSN: [id number] Arrival date & time: 03/15/22  [id number]     History  Chief Complaint  Patient presents with   Shortness of Breath    Timothy Maldonado is a 71 y.o. male.  Patient here with shortness of breath, cough.  Increased work of breathing here the last several days last 3 to 4 days.  History of COPD, valvular heart disease.  No major change in his sputum.  No fever.  Has been using nebulizer at home without much relief.  Denies any chest pain, recent illness.  He is on 2 L of oxygen at baseline.  Denies any recent surgery or travel.  Denies any abdominal pain, leg swelling, weakness, numbness.  The history is provided by the patient.       Home Medications Prior to Admission medications   Medication Sig Start Date End Date Taking? Authorizing Provider  azithromycin (ZITHROMAX) 250 MG tablet Take first 2 tablets together on day 1, then take 1 tablet every day until finished. 03/15/22  Yes Raeley Gilmore, DO  predniSONE (DELTASONE) 20 MG tablet Take 3 tablets (60 mg total) by mouth daily for 5 days. 03/15/22 03/20/22 Yes Marcee Jacobs, DO  albuterol (PROVENTIL) (2.5 MG/3ML) 0.083% nebulizer solution Take 3 mLs (2.5 mg total) by nebulization every 4 (four) hours as needed for wheezing or shortness of breath. 04/21/20   Tanda Rockers, MD  albuterol (VENTOLIN HFA) 108 (90 Base) MCG/ACT inhaler Inhale 2 puffs into the lungs every 4 (four) hours as needed for wheezing or shortness of breath. 05/22/20   Tanda Rockers, MD  ALPRAZolam Duanne Moron) 0.25 MG tablet Take 0.25 mg by mouth 2 (two) times daily as needed. 07/26/21   [provider]  aspirin 81 MG tablet Take 81 mg by mouth daily.    [provider]  CRESTOR 20 MG tablet Take 20 mg by mouth daily.  01/05/15   [provider]  ezetimibe (ZETIA) 10 MG tablet Take 10 mg by mouth daily. 11/20/19   [provider]  Glycopyrrolate-Formoterol (BEVESPI  AEROSPHERE) 9-4.8 MCG/ACT AERO Inhale 2 puffs into the lungs 2 (two) times daily. 03/01/22   Hunsucker, Bonna Gains, MD  hydrochlorothiazide (MICROZIDE) 12.5 MG capsule Take 12.5 mg by mouth daily.    [provider]  irbesartan (AVAPRO) 150 MG tablet Take 150 mg by mouth daily after supper.     [provider]      Allergies    Sulfonamide derivatives    Review of Systems   Review of Systems  Physical Exam Updated Vital Signs  ED Triage Vitals  Enc Vitals Group     BP 03/15/22 0726 (!) 157/84     Pulse Rate 03/15/22 0726 (!) 109     Resp 03/15/22 0726 (!) 32     Temp 03/15/22 0726 98.4 F (36.9 C)     Temp src --      SpO2 03/15/22 0720 (!) 85 %     Weight 03/15/22 0721 140 lb (63.5 kg)     Height 03/15/22 0721 5\' 10"  (1.778 m)     Head Circumference --      Peak Flow --      Pain Score 03/15/22 0726 0     Pain Loc --      Pain Edu? --      Excl. in [place]? --   \ Physical Exam Vitals and nursing note reviewed.  Constitutional:  General: He is not in acute distress.    Appearance: He is well-developed. He is not ill-appearing.  HENT:     Head: Normocephalic and atraumatic.  Eyes:     Extraocular Movements: Extraocular movements intact.     Conjunctiva/sclera: Conjunctivae normal.     Pupils: Pupils are equal, round, and reactive to light.  Cardiovascular:     Rate and Rhythm: Normal rate and regular rhythm.     Pulses: Normal pulses.     Heart sounds: No murmur heard. Pulmonary:     Effort: Tachypnea present. No respiratory distress.     Breath sounds: Decreased breath sounds and wheezing present.  Abdominal:     Palpations: Abdomen is soft.     Tenderness: There is no abdominal tenderness.  Musculoskeletal:        General: No swelling.     Cervical back: Normal range of motion and neck supple.     Right lower leg: No edema.     Left lower leg: No edema.  Skin:    General: Skin is warm and dry.     Capillary Refill: Capillary refill takes  less than 2 seconds.  Neurological:     General: No focal deficit present.     Mental Status: He is alert.  Psychiatric:        Mood and Affect: Mood normal.     ED Results / Procedures / Treatments   Labs (all labs ordered are listed, but only abnormal results are displayed) Labs Reviewed  CBC WITH DIFFERENTIAL/PLATELET - Abnormal; Notable for the following components:      Result Value   WBC 11.9 (*)    Neutro Abs 9.3 (*)    All other components within normal limits  BASIC METABOLIC PANEL - Abnormal; Notable for the following components:   Glucose, Bld 118 (*)    All other components within normal limits  SARS CORONAVIRUS 2 BY RT PCR  BRAIN NATRIURETIC PEPTIDE  TROPONIN I (HIGH SENSITIVITY)    EKG EKG Interpretation  Date/Time:  Tuesday March 15 2022 07:22:00 EDT Ventricular Rate:  100 PR Interval:  143 QRS Duration: 132 QT Interval:  355 QTC Calculation: 458 R Axis:   85 Text Interpretation: Sinus tachycardia Right bundle branch block Confirmed by Virgina Norfolk ([id number]) on 03/15/2022 7:24:02 AM  Radiology DG Chest Portable 1 View  Result Date: 03/15/2022 CLINICAL DATA:  71 year old male with history of shortness of breath and difficulty breathing. EXAM: PORTABLE CHEST 1 VIEW COMPARISON:  Chest x-ray 12/28/2020. FINDINGS: Lung volumes are increased with emphysematous changes. No consolidative airspace disease. No pleural effusions. No pneumothorax. No pulmonary nodule or mass noted. Pulmonary vasculature and the cardiomediastinal silhouette are within normal limits. IMPRESSION: 1. No radiographic evidence of acute cardiopulmonary disease. 2. Emphysema. Electronically Signed   By: Trudie Reed M.D.   On: 03/15/2022 07:31    Procedures Procedures    Medications Ordered in ED Medications  methylPREDNISolone sodium succinate (SOLU-MEDROL) 125 mg/2 mL injection 125 mg (125 mg Intravenous Given 03/15/22 0740)  ipratropium-albuterol (DUONEB) 0.5-2.5 (3) MG/3ML nebulizer  solution 3 mL (3 mLs Nebulization Given 03/15/22 9470)    ED Course/ Medical Decision Making/ A&P                           Medical Decision Making Amount and/or Complexity of Data Reviewed Labs: ordered. Radiology: ordered.  Risk Prescription drug management.   Timothy Maldonado is here with shortness of breath.  History of COPD.  He wears supplemental oxygen at home.  He has been wearing 2 to 3 L last several days.  Symptoms for the last 3 to 4 days with cough.  Denies any fevers or chills.  Differential diagnosis is COPD exacerbation/viral process/pneumonia.  Does not have a history of heart failure or CAD and these processes seem less likely.  Will check for COVID we will get CBC, BMP, BNP, troponin, EKG, chest x-ray.  Will give DuoNeb treatment and IV steroids and reevaluate.  He does have diminished breath sounds throughout, increased work of breathing.  Higher than normal oxygen use at home.  EKG shows sinus rhythm.  No ischemic changes per my review and interpretation.  Chest x-ray per my review and interpretation shows no obvious pneumonia or pneumothorax.  Per my review and interpretation of labs troponin is normal, BNP is normal.  Doubt ACS or heart failure.  COVID test is negative.  Mild leukocytosis of 11.9 but otherwise unremarkable labs.  No significant anemia or electrolyte abnormality or kidney injury.  Patient feeling better after breathing treatment and steroids.  My suspicion that this is a COPD exacerbation.  Possibly from a viral process or weather changes.  Patient with mildly increased work of breathing.  I have recommended for observation stay however patient prefers outpatient treatment.  I do not think that this is unreasonable as he does have oxygen at home.  We will do another breathing treatment and rediscuss this plan.  Patient feeling better.  Shared decision made with he and his wife about observation stay versus outpatient treatment.  They prefer to try steroids  and antibiotics at home breathing treatments.  I think this is reasonable.  Discharged in good condition.  Understands return precautions.  This chart was dictated using voice recognition software.  Despite best efforts to proofread,  errors can occur which can change the documentation meaning.         Final Clinical Impression(s) / ED Diagnoses Final diagnoses:  COPD exacerbation (HCC)    Rx / DC Orders ED Discharge Orders          Ordered    azithromycin (ZITHROMAX) 250 MG tablet  Daily        03/15/22 0932    predniSONE (DELTASONE) 20 MG tablet  Daily        03/15/22 0932              Virgina Norfolk, DO 03/15/22 [phone]

## 2022-03-21 DIAGNOSIS — I700 Atherosclerosis of aorta: Secondary | ICD-10-CM | POA: Diagnosis not present

## 2022-03-21 DIAGNOSIS — E7800 Pure hypercholesterolemia, unspecified: Secondary | ICD-10-CM | POA: Diagnosis not present

## 2022-03-21 DIAGNOSIS — R69 Illness, unspecified: Secondary | ICD-10-CM | POA: Diagnosis not present

## 2022-03-21 DIAGNOSIS — Z0000 Encounter for general adult medical examination without abnormal findings: Secondary | ICD-10-CM | POA: Diagnosis not present

## 2022-03-21 DIAGNOSIS — J439 Emphysema, unspecified: Secondary | ICD-10-CM | POA: Diagnosis not present

## 2022-03-21 DIAGNOSIS — J9611 Chronic respiratory failure with hypoxia: Secondary | ICD-10-CM | POA: Diagnosis not present

## 2022-03-21 DIAGNOSIS — Z23 Encounter for immunization: Secondary | ICD-10-CM | POA: Diagnosis not present

## 2022-03-21 DIAGNOSIS — I2510 Atherosclerotic heart disease of native coronary artery without angina pectoris: Secondary | ICD-10-CM | POA: Diagnosis not present

## 2022-03-21 DIAGNOSIS — I10 Essential (primary) hypertension: Secondary | ICD-10-CM | POA: Diagnosis not present

## 2022-03-21 DIAGNOSIS — Z125 Encounter for screening for malignant neoplasm of prostate: Secondary | ICD-10-CM | POA: Diagnosis not present

## 2022-03-21 DIAGNOSIS — I712 Thoracic aortic aneurysm, without rupture, unspecified: Secondary | ICD-10-CM | POA: Diagnosis not present

## 2022-03-21 DIAGNOSIS — I340 Nonrheumatic mitral (valve) insufficiency: Secondary | ICD-10-CM | POA: Diagnosis not present

## 2022-03-21 DIAGNOSIS — I6529 Occlusion and stenosis of unspecified carotid artery: Secondary | ICD-10-CM | POA: Diagnosis not present

## 2022-03-24 ENCOUNTER — Other Ambulatory Visit: Payer: Self-pay | Admitting: *Deleted

## 2022-03-24 DIAGNOSIS — Z122 Encounter for screening for malignant neoplasm of respiratory organs: Secondary | ICD-10-CM

## 2022-03-24 DIAGNOSIS — Z87891 Personal history of nicotine dependence: Secondary | ICD-10-CM

## 2022-04-01 ENCOUNTER — Other Ambulatory Visit: Payer: Self-pay

## 2022-04-01 ENCOUNTER — Emergency Department (HOSPITAL_COMMUNITY): Payer: Medicare HMO

## 2022-04-01 ENCOUNTER — Telehealth: Payer: Self-pay | Admitting: Pulmonary Disease

## 2022-04-01 ENCOUNTER — Encounter (HOSPITAL_COMMUNITY): Payer: Self-pay

## 2022-04-01 ENCOUNTER — Observation Stay (HOSPITAL_COMMUNITY)
Admission: EM | Admit: 2022-04-01 | Discharge: 2022-04-02 | Disposition: A | Discharge status: Home / Self Care | Payer: Medicare HMO | Attending: Internal Medicine | Admitting: Internal Medicine

## 2022-04-01 DIAGNOSIS — I10 Essential (primary) hypertension: Secondary | ICD-10-CM | POA: Insufficient documentation

## 2022-04-01 DIAGNOSIS — Z743 Need for continuous supervision: Secondary | ICD-10-CM | POA: Diagnosis not present

## 2022-04-01 DIAGNOSIS — Z7982 Long term (current) use of aspirin: Secondary | ICD-10-CM | POA: Insufficient documentation

## 2022-04-01 DIAGNOSIS — F4322 Adjustment disorder with anxiety: Secondary | ICD-10-CM | POA: Diagnosis present

## 2022-04-01 DIAGNOSIS — E785 Hyperlipidemia, unspecified: Secondary | ICD-10-CM | POA: Diagnosis present

## 2022-04-01 DIAGNOSIS — Z87891 Personal history of nicotine dependence: Secondary | ICD-10-CM | POA: Insufficient documentation

## 2022-04-01 DIAGNOSIS — J441 Chronic obstructive pulmonary disease with (acute) exacerbation: Principal | ICD-10-CM | POA: Insufficient documentation

## 2022-04-01 DIAGNOSIS — R0602 Shortness of breath: Secondary | ICD-10-CM | POA: Diagnosis not present

## 2022-04-01 DIAGNOSIS — J9621 Acute and chronic respiratory failure with hypoxia: Secondary | ICD-10-CM | POA: Diagnosis not present

## 2022-04-01 DIAGNOSIS — J80 Acute respiratory distress syndrome: Secondary | ICD-10-CM | POA: Diagnosis not present

## 2022-04-01 DIAGNOSIS — R0689 Other abnormalities of breathing: Secondary | ICD-10-CM | POA: Diagnosis not present

## 2022-04-01 DIAGNOSIS — Z79899 Other long term (current) drug therapy: Secondary | ICD-10-CM | POA: Insufficient documentation

## 2022-04-01 DIAGNOSIS — J9611 Chronic respiratory failure with hypoxia: Secondary | ICD-10-CM | POA: Diagnosis present

## 2022-04-01 LAB — CBC WITH DIFFERENTIAL/PLATELET
Abs Immature Granulocytes: 0.03 10*3/uL (ref 0.00–0.07)
Basophils Absolute: 0.1 10*3/uL (ref 0.0–0.1)
Basophils Relative: 1 %
Eosinophils Absolute: 0.2 10*3/uL (ref 0.0–0.5)
Eosinophils Relative: 1 %
HCT: 43.7 % (ref 39.0–52.0)
Hemoglobin: 13.7 g/dL (ref 13.0–17.0)
Immature Granulocytes: 0 %
Lymphocytes Relative: 10 %
Lymphs Abs: 1.1 10*3/uL (ref 0.7–4.0)
MCH: 29.8 pg (ref 26.0–34.0)
MCHC: 31.4 g/dL (ref 30.0–36.0)
MCV: 95 fL (ref 80.0–100.0)
Monocytes Absolute: 0.4 10*3/uL (ref 0.1–1.0)
Monocytes Relative: 4 %
Neutro Abs: 9.5 10*3/uL — ABNORMAL HIGH (ref 1.7–7.7)
Neutrophils Relative %: 84 %
Platelets: 265 10*3/uL (ref 150–400)
RBC: 4.6 MIL/uL (ref 4.22–5.81)
RDW: 13.2 % (ref 11.5–15.5)
WBC: 11.3 10*3/uL — ABNORMAL HIGH (ref 4.0–10.5)
nRBC: 0 % (ref 0.0–0.2)

## 2022-04-01 LAB — COMPREHENSIVE METABOLIC PANEL
ALT: 28 U/L (ref 0–44)
AST: 22 U/L (ref 15–41)
Albumin: 3.7 g/dL (ref 3.5–5.0)
Alkaline Phosphatase: 81 U/L (ref 38–126)
Anion gap: 8 (ref 5–15)
BUN: 20 mg/dL (ref 8–23)
CO2: 33 mmol/L — ABNORMAL HIGH (ref 22–32)
Calcium: 9.1 mg/dL (ref 8.9–10.3)
Chloride: 99 mmol/L (ref 98–111)
Creatinine, Ser: 0.88 mg/dL (ref 0.61–1.24)
GFR, Estimated: >60 mL/min (ref >60)
Glucose, Bld: 122 mg/dL — ABNORMAL HIGH (ref 70–99)
Potassium: 4 mmol/L (ref 3.5–5.1)
Sodium: 140 mmol/L (ref 135–145)
Total Bilirubin: 0.9 mg/dL (ref 0.3–1.2)
Total Protein: 6.9 g/dL (ref 6.5–8.1)

## 2022-04-01 LAB — BRAIN NATRIURETIC PEPTIDE: B Natriuretic Peptide: 84.8 pg/mL (ref 0.0–100.0)

## 2022-04-01 MED ORDER — METHYLPREDNISOLONE SODIUM SUCC 40 MG IJ SOLR
40.0000 mg | Freq: Two times a day (BID) | INTRAMUSCULAR | Status: DC
  Administered 2022-04-01 – 2022-04-02 (×2): 40 mg via INTRAVENOUS
  Filled 2022-04-01 (×2): qty 1

## 2022-04-01 MED ORDER — ROSUVASTATIN CALCIUM 20 MG PO TABS
20.0000 mg | ORAL_TABLET | Freq: Every day | ORAL | Status: DC
  Administered 2022-04-02: 20 mg via ORAL
  Filled 2022-04-01: qty 1

## 2022-04-01 MED ORDER — ARFORMOTEROL TARTRATE 15 MCG/2ML IN NEBU
15.0000 ug | INHALATION_SOLUTION | Freq: Two times a day (BID) | RESPIRATORY_TRACT | Status: DC
  Administered 2022-04-01 – 2022-04-02 (×2): 15 ug via RESPIRATORY_TRACT
  Filled 2022-04-01 (×3): qty 2

## 2022-04-01 MED ORDER — ALBUTEROL SULFATE HFA 108 (90 BASE) MCG/ACT IN AERS: 2.0000 | INHALATION_SPRAY | RESPIRATORY_TRACT | 11 refills | Status: DC | PRN

## 2022-04-01 MED ORDER — SERTRALINE HCL 25 MG PO TABS
25.0000 mg | ORAL_TABLET | Freq: Once | ORAL | Status: AC
  Administered 2022-04-01: 25 mg via ORAL
  Filled 2022-04-01: qty 1

## 2022-04-01 MED ORDER — EZETIMIBE 10 MG PO TABS
10.0000 mg | ORAL_TABLET | Freq: Every day | ORAL | Status: DC
  Administered 2022-04-02: 10 mg via ORAL
  Filled 2022-04-01: qty 1

## 2022-04-01 MED ORDER — ASPIRIN 81 MG PO TBEC
81.0000 mg | DELAYED_RELEASE_TABLET | Freq: Every day | ORAL | Status: DC
  Administered 2022-04-02: 81 mg via ORAL
  Filled 2022-04-01: qty 1

## 2022-04-01 MED ORDER — ALBUTEROL SULFATE (2.5 MG/3ML) 0.083% IN NEBU
2.5000 mg | INHALATION_SOLUTION | Freq: Once | RESPIRATORY_TRACT | Status: AC
  Administered 2022-04-01: 2.5 mg via RESPIRATORY_TRACT
  Filled 2022-04-01: qty 3

## 2022-04-01 MED ORDER — HYDROCHLOROTHIAZIDE 12.5 MG PO TABS
12.5000 mg | ORAL_TABLET | Freq: Every day | ORAL | Status: DC
  Administered 2022-04-02: 12.5 mg via ORAL
  Filled 2022-04-01: qty 1

## 2022-04-01 MED ORDER — SERTRALINE HCL 50 MG PO TABS
50.0000 mg | ORAL_TABLET | Freq: Every day | ORAL | Status: DC
  Administered 2022-04-02: 50 mg via ORAL
  Filled 2022-04-01: qty 1

## 2022-04-01 MED ORDER — ALPRAZOLAM 0.25 MG PO TABS
0.2500 mg | ORAL_TABLET | Freq: Two times a day (BID) | ORAL | Status: DC | PRN
  Administered 2022-04-01 – 2022-04-02 (×2): 0.25 mg via ORAL
  Filled 2022-04-01 (×2): qty 1

## 2022-04-01 MED ORDER — PREDNISONE 20 MG PO TABS: 40.0000 mg | ORAL_TABLET | Freq: Every day | ORAL | Status: DC

## 2022-04-01 MED ORDER — ALBUTEROL SULFATE (2.5 MG/3ML) 0.083% IN NEBU: 2.5000 mg | INHALATION_SOLUTION | RESPIRATORY_TRACT | 12 refills | Status: AC | PRN

## 2022-04-01 MED ORDER — GUAIFENESIN ER 600 MG PO TB12
600.0000 mg | ORAL_TABLET | Freq: Two times a day (BID) | ORAL | Status: DC
  Administered 2022-04-01 – 2022-04-02 (×2): 600 mg via ORAL
  Filled 2022-04-01 (×2): qty 1

## 2022-04-01 MED ORDER — UMECLIDINIUM BROMIDE 62.5 MCG/ACT IN AEPB
1.0000 | INHALATION_SPRAY | Freq: Every day | RESPIRATORY_TRACT | Status: DC
  Administered 2022-04-02: 1 via RESPIRATORY_TRACT
  Filled 2022-04-01: qty 7

## 2022-04-01 MED ORDER — MAGNESIUM SULFATE 2 GM/50ML IV SOLN
2.0000 g | Freq: Once | INTRAVENOUS | Status: AC
  Administered 2022-04-01: 2 g via INTRAVENOUS
  Filled 2022-04-01: qty 50

## 2022-04-01 MED ORDER — IRBESARTAN 150 MG PO TABS: 150.0000 mg | ORAL_TABLET | Freq: Every day | ORAL | Status: DC

## 2022-04-01 MED ORDER — IPRATROPIUM-ALBUTEROL 0.5-2.5 (3) MG/3ML IN SOLN
3.0000 mL | Freq: Four times a day (QID) | RESPIRATORY_TRACT | Status: DC
  Administered 2022-04-01 – 2022-04-02 (×4): 3 mL via RESPIRATORY_TRACT
  Filled 2022-04-01 (×4): qty 3

## 2022-04-01 MED ORDER — BUDESONIDE 0.5 MG/2ML IN SUSP
0.5000 mg | Freq: Two times a day (BID) | RESPIRATORY_TRACT | Status: DC
  Administered 2022-04-01 – 2022-04-02 (×2): 0.5 mg via RESPIRATORY_TRACT
  Filled 2022-04-01 (×2): qty 2

## 2022-04-01 MED ORDER — IPRATROPIUM-ALBUTEROL 0.5-2.5 (3) MG/3ML IN SOLN
3.0000 mL | Freq: Once | RESPIRATORY_TRACT | Status: AC
  Administered 2022-04-01: 3 mL via RESPIRATORY_TRACT
  Filled 2022-04-01: qty 3

## 2022-04-01 MED ORDER — IBUPROFEN 400 MG PO TABS: 400.0000 mg | ORAL_TABLET | Freq: Four times a day (QID) | ORAL | Status: DC | PRN

## 2022-04-01 MED ORDER — SODIUM CHLORIDE 0.9 % IV SOLN
500.0000 mg | INTRAVENOUS | Status: AC
  Administered 2022-04-01: 500 mg via INTRAVENOUS
  Filled 2022-04-01: qty 5

## 2022-04-01 MED ORDER — AZITHROMYCIN 250 MG PO TABS: 500.0000 mg | ORAL_TABLET | Freq: Every day | ORAL | Status: DC

## 2022-04-01 MED ORDER — ENOXAPARIN SODIUM 40 MG/0.4ML IJ SOSY
40.0000 mg | PREFILLED_SYRINGE | INTRAMUSCULAR | Status: DC
  Administered 2022-04-01: 40 mg via SUBCUTANEOUS
  Filled 2022-04-01: qty 0.4

## 2022-04-01 NOTE — Telephone Encounter (Signed)
Rx for albuterol inhaler and solution have been sent to pharmacy for pt. Called and spoke with pt's spouse Hilda Blades letting her know this had been done.  While on the phone with Hilda Blades, she stated that EMS had to be called for pt due to pt struggling to catch his breath. Hilda Blades stated that pt's sats were good in the 90s when EMS got there to check him out but pt was still struggling to breathe. Pt has been having a lot of problems recently with his anxiety which is also really affecting his breathing.  Hilda Blades said that they were going to transport him to the hospital to be evaluated. I did tell Hilda Blades that we might need to get pt in for a sooner appt after pt is out of the hospital as his next appt with [place] is not until December 2023 and she verbalized understanding.  Routing this to Dr. Silas Flood as an Juluis Rainier.

## 2022-04-01 NOTE — H&P (Signed)
History and Physical    Erica Osuna SPQ:[id number] DOB: 06/03/1951 DOA: 04/01/2022  PCP: Deland Pretty, MD   Patient coming from: Home    Chief Complaint: Shortness of breath  HPI: Timothy Maldonado is a 71 y.o. male with medical history significant of COPD, hypertension, hyperlipidemia, anxiety, on 2 L of oxygen at home presented with shortness of breath for a week. Patient lives with his wife and sons.  He quit the smoking about a year ago. Since last 6 or 7 days, he has been feeling more short of breath, dyspneic on minimal exertion.  He says that he has been maintaining saturation even on ambulation at home. Patient seen and examined at bedside in the emergency department.  During my evaluation, he was hemodynamically stable, in mild sinus tachycardia.  He was able to speak in full sentences. No report of fever, chills, abdominal pain, chest pain, nausea, vomiting, dysuria, hematochezia or melena.  ED Course:  On presentation, he had bilateral expiratory wheezesLab work showed mild leukocytosis.  Chest x-ray did not show any pneumonia.  Patient was treated with DuoNeb, magnesium.  He maintained his saturation while ambulating on the hallway but he was exerted and had persistent wheezing so we requested for admission.  Review of Systems: As per HPI otherwise 10 point review of systems negative.    Past Medical History:  Diagnosis Date   Bursitis    Carotid atherosclerosis    COPD (chronic obstructive pulmonary disease) (HCC)    Hyperlipidemia, group A 07/21/2018   Libido, decreased    Low back pain    Plantar wart    Screening for abdominal aortic aneurysm (AAA) performed 07/21/2018   Abdominal aortic duplex 01/04/2018: The maximum aorta diameter is 2.48 cm (prox).  Ectasia of the abdominal aorta measuring 2.48 x 2.4 x 2.47 cm is seen.  Focal plaque observed in the proximal, mid and distal aorta. Peak systolic velocities in the left internal iliac artery are mildly increased  suggests <50% stenosis. Enlarged inferior vena cava with normal respiratory variation suggests elevated     History reviewed. No pertinent surgical history.   reports that he quit smoking about 2 years ago. His smoking use included cigarettes. He has a 48.00 pack-year smoking history. He has never used smokeless tobacco. He reports current alcohol use. He reports that he does not use drugs.  Allergies  Allergen Reactions   Lipitor [Atorvastatin] Other (See Comments)    Myalgias   Sulfonamide Derivatives Other (See Comments)    Childhood allergy unknown reaction     Family History  Problem Relation Age of Onset   Kidney disease Mother 31   Heart attack Father    Heart disease Father      Prior to Admission medications   Medication Sig Start Date End Date Taking? Authorizing Provider  albuterol (PROVENTIL) (2.5 MG/3ML) 0.083% nebulizer solution Take 3 mLs (2.5 mg total) by nebulization every 4 (four) hours as needed for wheezing or shortness of breath. 04/01/22  Yes Hunsucker, Bonna Gains, MD  albuterol (VENTOLIN HFA) 108 (90 Base) MCG/ACT inhaler Inhale 2 puffs into the lungs every 4 (four) hours as needed for wheezing or shortness of breath. 04/01/22  Yes Hunsucker, Bonna Gains, MD  ALPRAZolam Duanne Moron) 0.25 MG tablet Take 0.25 mg by mouth 2 (two) times daily as needed for anxiety. 07/26/21  Yes [provider]  aspirin 81 MG tablet Take 81 mg by mouth daily.   Yes [provider]  CRESTOR 20 MG tablet Take 20  mg by mouth daily.  01/05/15  Yes [provider]  ezetimibe (ZETIA) 10 MG tablet Take 10 mg by mouth daily. 11/20/19  Yes [provider]  Glycopyrrolate-Formoterol (BEVESPI AEROSPHERE) 9-4.8 MCG/ACT AERO Inhale 2 puffs into the lungs 2 (two) times daily. 03/01/22  Yes Hunsucker, Lesia Sago, MD  hydrochlorothiazide (MICROZIDE) 12.5 MG capsule Take 12.5 mg by mouth daily.   Yes [provider]  ibuprofen (ADVIL) 200 MG tablet Take 400 mg by  mouth every 6 (six) hours as needed for mild pain, moderate pain or headache.   Yes [provider]  irbesartan (AVAPRO) 150 MG tablet Take 150 mg by mouth daily after supper.    Yes [provider]  OXYGEN Inhale 2 L into the lungs See admin instructions. Continuously   Yes [provider]  sertraline (ZOLOFT) 50 MG tablet Take 25-50 mg by mouth See admin instructions. Take 1/2 tablet (25 mg) by mouth daily for 4 days then take 1 whole tablet (50 mg) daily 03/21/22  Yes [provider]  azithromycin (ZITHROMAX) 250 MG tablet Take first 2 tablets together on day 1, then take 1 tablet every day until finished. Patient not taking: Reported on 04/01/2022 03/15/22   Virgina Norfolk, DO    Physical Exam: Vitals:   04/01/22 1143 04/01/22 1201 04/01/22 1516  BP: 102/78    Pulse: 96    Resp: 20    Temp: 98.5 F (36.9 C)    TempSrc: Oral    SpO2: 100% 100% 98%  Weight:  68 kg   Height:  5\' 10"  (1.778 m)     Constitutional: NAD, calm, comfortable,pleasant gentleman Vitals:   04/01/22 1143 04/01/22 1201 04/01/22 1516  BP: 102/78    Pulse: 96    Resp: 20    Temp: 98.5 F (36.9 C)    TempSrc: Oral    SpO2: 100% 100% 98%  Weight:  68 kg   Height:  5\' 10"  (1.778 m)    Eyes: PERRL, lids and conjunctivae normal ENMT: Mucous membranes are moist.  Neck: normal, supple, no masses, no thyromegaly Respiratory: Bilateral expiratory wheezing, no crackles. Normal respiratory effort. No accessory muscle use.  Cardiovascular: sinus tachycardia, no murmurs / rubs / gallops. No extremity edema.  Abdomen: no tenderness, no masses palpated. No hepatosplenomegaly. Bowel sounds positive.  Musculoskeletal: no clubbing / cyanosis. No joint deformity upper and lower extremities.  Skin: no rashes, lesions, ulcers. No induration Neurologic: CN 2-12 grossly intact.  Strength 5/5 in all 4.  Psychiatric: Normal judgment and insight. Alert and oriented x 3. Normal mood.   Foley  Catheter:None  Labs on Admission: I have personally reviewed following labs and imaging studies  CBC: Recent Labs  Lab 04/01/22 1225  WBC 11.3*  NEUTROABS 9.5*  HGB 13.7  HCT 43.7  MCV 95.0  PLT 265   Basic Metabolic Panel: Recent Labs  Lab 04/01/22 1225  NA 140  K 4.0  CL 99  CO2 33*  GLUCOSE 122*  BUN 20  CREATININE 0.88  CALCIUM 9.1   GFR: Estimated Creatinine Clearance: 75.1 mL/min (by C-G formula based on SCr of 0.88 mg/dL). Liver Function Tests: Recent Labs  Lab 04/01/22 1225  AST 22  ALT 28  ALKPHOS 81  BILITOT 0.9  PROT 6.9  ALBUMIN 3.7   No results for input(s): "LIPASE", "AMYLASE" in the last 168 hours. No results for input(s): "AMMONIA" in the last 168 hours. Coagulation Profile: No results for input(s): "INR", "PROTIME" in the last  168 hours. Cardiac Enzymes: No results for input(s): "CKTOTAL", "CKMB", "CKMBINDEX", "TROPONINI" in the last 168 hours. BNP (last 3 results) No results for input(s): "PROBNP" in the last 8760 hours. HbA1C: No results for input(s): "HGBA1C" in the last 72 hours. CBG: No results for input(s): "GLUCAP" in the last 168 hours. Lipid Profile: No results for input(s): "CHOL", "HDL", "LDLCALC", "TRIG", "CHOLHDL", "LDLDIRECT" in the last 72 hours. Thyroid Function Tests: No results for input(s): "TSH", "T4TOTAL", "FREET4", "T3FREE", "THYROIDAB" in the last 72 hours. Anemia Panel: No results for input(s): "VITAMINB12", "FOLATE", "FERRITIN", "TIBC", "IRON", "RETICCTPCT" in the last 72 hours. Urine analysis: No results found for: "COLORURINE", "APPEARANCEUR", "LABSPEC", "PHURINE", "GLUCOSEU", "HGBUR", "BILIRUBINUR", "KETONESUR", "PROTEINUR", "UROBILINOGEN", "NITRITE", "LEUKOCYTESUR"  Radiological Exams on Admission: DG Chest Port 1 View  Result Date: 04/01/2022 CLINICAL DATA:  Shortness of breath EXAM: PORTABLE CHEST 1 VIEW COMPARISON:  chest x-ray dated March 15, 2022 FINDINGS: Cardiac and mediastinal contours within  normal limits. Lungs are clear. No pleural effusion or pneumothorax. IMPRESSION: No active disease. Electronically Signed   By: Allegra Lai M.D.   On: 04/01/2022 12:19     Assessment/Plan Principal Problem:   COPD exacerbation (HCC) Active Problems:   HLD (hyperlipidemia)   Adjustment disorder with anxiety   Essential hypertension   Chronic respiratory failure with hypoxia (HCC)   COPD exacerbation: Presented with 1 week history of dyspnea, wheezing.  Chest x-ray did not show pneumonia.  Continue bronchodilators, steroids.  Started on Solu-Medrol. Started  on azithromycin,mucinex,duoneb,pulmicort. He follows with Dr. Judeth Horn. He is a past smoker.  Acute on chronic hypoxic respiratory failure: On 2 L of oxygen at home.  Requiring 3 L here.  We will try to wean to his baseline oxygen requirement.  Hypertension: Takes hydrochlorothiazide, irbesartan at home.  Resumed  History of anxiety/adjustment disorder: On Xanax, sertraline  Hyperlipidemia: Zetia, Crestor.       Severity of Illness: The appropriate patient status for this patient is OBSERVATION.    DVT prophylaxis: Lovenox Code Status: Full Family Communication: None at bedside Consults called: None     Burnadette Pop MD [facility]  04/01/2022, 5:15 PM

## 2022-04-01 NOTE — ED Notes (Signed)
Pt was ambulated in hallway. Pt had slow but steady gait and O2 did not drop below 95%. Pt did state concern with exertion.

## 2022-04-01 NOTE — ED Triage Notes (Signed)
Patient present to the clinic with c/o shortness of breath for 1 week. COPD & Emphysema. Patient given 2 Duo Nebs, 125 mg solu-medrol.

## 2022-04-01 NOTE — ED Provider Notes (Signed)
[facility]-EMERGENCY DEPT Provider Note   CSN: [id number] Arrival date & time: 04/01/22  1132     History {Add pertinent medical, surgical, social history, OB history to HPI:1} No chief complaint on file.   Timothy Maldonado is a 71 y.o. male.  Patient complains of shortness of breath.  Patient has a history of COPD and is usually on 2 L of oxygen   Shortness of Breath      Home Medications Prior to Admission medications   Medication Sig Start Date End Date Taking? Authorizing Provider  albuterol (PROVENTIL) (2.5 MG/3ML) 0.083% nebulizer solution Take 3 mLs (2.5 mg total) by nebulization every 4 (four) hours as needed for wheezing or shortness of breath. 04/01/22  Yes Hunsucker, Lesia Sago, MD  albuterol (VENTOLIN HFA) 108 (90 Base) MCG/ACT inhaler Inhale 2 puffs into the lungs every 4 (four) hours as needed for wheezing or shortness of breath. 04/01/22  Yes Hunsucker, Lesia Sago, MD  ALPRAZolam Prudy Feeler) 0.25 MG tablet Take 0.25 mg by mouth 2 (two) times daily as needed for anxiety. 07/26/21  Yes [provider]  aspirin 81 MG tablet Take 81 mg by mouth daily.   Yes [provider]  CRESTOR 20 MG tablet Take 20 mg by mouth daily.  01/05/15  Yes [provider]  ezetimibe (ZETIA) 10 MG tablet Take 10 mg by mouth daily. 11/20/19  Yes [provider]  Glycopyrrolate-Formoterol (BEVESPI AEROSPHERE) 9-4.8 MCG/ACT AERO Inhale 2 puffs into the lungs 2 (two) times daily. 03/01/22  Yes Hunsucker, Lesia Sago, MD  hydrochlorothiazide (MICROZIDE) 12.5 MG capsule Take 12.5 mg by mouth daily.   Yes [provider]  ibuprofen (ADVIL) 200 MG tablet Take 400 mg by mouth every 6 (six) hours as needed for mild pain, moderate pain or headache.   Yes [provider]  irbesartan (AVAPRO) 150 MG tablet Take 150 mg by mouth daily after supper.    Yes [provider]  OXYGEN Inhale 2 L into the lungs See admin instructions.  Continuously   Yes [provider]  sertraline (ZOLOFT) 50 MG tablet Take 25-50 mg by mouth See admin instructions. Take 1/2 tablet (25 mg) by mouth daily for 4 days then take 1 whole tablet (50 mg) daily 03/21/22  Yes [provider]  azithromycin (ZITHROMAX) 250 MG tablet Take first 2 tablets together on day 1, then take 1 tablet every day until finished. Patient not taking: Reported on 04/01/2022 03/15/22   Virgina Norfolk, DO      Allergies    Lipitor [atorvastatin] and Sulfonamide derivatives    Review of Systems   Review of Systems  Respiratory:  Positive for shortness of breath.     Physical Exam Updated Vital Signs BP 102/78 (BP Location: Right Arm)   Pulse 96   Temp 98.5 F (36.9 C) (Oral)   Resp 20   Ht 5\' 10"  (1.778 m)   Wt 68 kg   SpO2 98%   BMI 21.52 kg/m  Physical Exam  ED Results / Procedures / Treatments   Labs (all labs ordered are listed, but only abnormal results are displayed) Labs Reviewed  CBC WITH DIFFERENTIAL/PLATELET - Abnormal; Notable for the following components:      Result Value   WBC 11.3 (*)    Neutro Abs 9.5 (*)    All other components within normal limits  COMPREHENSIVE METABOLIC PANEL - Abnormal; Notable for the following components:   CO2 33 (*)    Glucose, Bld 122 (*)  All other components within normal limits  BRAIN NATRIURETIC PEPTIDE    EKG None  Radiology DG Chest Port 1 View  Result Date: 04/01/2022 CLINICAL DATA:  Shortness of breath EXAM: PORTABLE CHEST 1 VIEW COMPARISON:  chest x-ray dated March 15, 2022 FINDINGS: Cardiac and mediastinal contours within normal limits. Lungs are clear. No pleural effusion or pneumothorax. IMPRESSION: No active disease. Electronically Signed   By: Yetta Glassman M.D.   On: 04/01/2022 12:19    Procedures Procedures  {Document cardiac monitor, telemetry assessment procedure when appropriate:1}  Medications Ordered in ED Medications  magnesium sulfate IVPB 2 g 50 mL  (2 g Intravenous New Bag/Given 04/01/22 1227)  ipratropium-albuterol (DUONEB) 0.5-2.5 (3) MG/3ML nebulizer solution 3 mL (3 mLs Nebulization Given 04/01/22 1515)  albuterol (PROVENTIL) (2.5 MG/3ML) 0.083% nebulizer solution 2.5 mg (2.5 mg Nebulization Given 04/01/22 1515)    ED Course/ Medical Decision Making/ A&P                           Medical Decision Making Amount and/or Complexity of Data Reviewed Labs: ordered. Radiology: ordered.  Risk Prescription drug management. Decision regarding hospitalization.   Patient with COPD exacerbation.  He will be admitted to medicine  {Document critical care time when appropriate:1} {Document review of labs and clinical decision tools ie heart score, Chads2Vasc2 etc:1}  {Document your independent review of radiology images, and any outside records:1} {Document your discussion with family members, caretakers, and with consultants:1} {Document social determinants of health affecting pt's care:1} {Document your decision making why or why not admission, treatments were needed:1} Final Clinical Impression(s) / ED Diagnoses Final diagnoses:  COPD exacerbation ([place])    Rx / DC Orders ED Discharge Orders     None

## 2022-04-02 DIAGNOSIS — J441 Chronic obstructive pulmonary disease with (acute) exacerbation: Secondary | ICD-10-CM | POA: Diagnosis not present

## 2022-04-02 MED ORDER — PREDNISONE 10 MG PO TABS: 10.0000 mg | ORAL_TABLET | Freq: Every day | ORAL | 0 refills | Status: AC

## 2022-04-02 MED ORDER — GUAIFENESIN ER 600 MG PO TB12: 600.0000 mg | ORAL_TABLET | Freq: Two times a day (BID) | ORAL | 0 refills | Status: AC

## 2022-04-02 MED ORDER — ADULT MULTIVITAMIN W/MINERALS CH
1.0000 | ORAL_TABLET | Freq: Every day | ORAL | Status: DC
  Administered 2022-04-02: 1 via ORAL
  Filled 2022-04-02: qty 1

## 2022-04-02 MED ORDER — ENSURE ENLIVE PO LIQD
237.0000 mL | Freq: Two times a day (BID) | ORAL | Status: DC
  Administered 2022-04-02 (×2): 237 mL via ORAL

## 2022-04-02 MED ORDER — AZITHROMYCIN 500 MG PO TABS: 500.0000 mg | ORAL_TABLET | Freq: Every day | ORAL | 0 refills | Status: AC

## 2022-04-02 NOTE — Evaluation (Signed)
Occupational Therapy Evaluation Patient Details Name: Timothy Maldonado MRN: [id number] DOB: May 15, 1951 Today's Date: 04/02/2022   History of Present Illness Timothy Maldonado is a 71 y.o. male with medical history significant of COPD, hypertension, hyperlipidemia, anxiety, on 2 L of oxygen at home presented with shortness of breath for a week.  Hemodynamically stable on presentation but wheezing so admitted for observation.  Chest x-ray did not show pneumonia.  He feels symptomatically much better today after starting on steroids   Clinical Impression         Patient admitted for above medical problems. Patient reports feeling more shakey with "New Medications."  Patient  uses 2-2.5 LPM O2 at baseline, is independent in his home.    Patient  hopes to DC today. No further OT needs.         Recommendations for follow up therapy are one component of a multi-disciplinary discharge planning process, led by the attending physician.  Recommendations may be updated based on patient status, additional functional criteria and insurance authorization.   Follow Up Recommendations  No OT follow up    Assistance Recommended at Discharge PRN  Patient can return home with the following Assistance with cooking/housework    Functional Status Assessment  Patient has had a recent decline in their functional status and demonstrates the ability to make significant improvements in function in a reasonable and predictable amount of time.  Equipment Recommendations  None recommended by OT       Precautions / Restrictions Precautions Precautions: Fall Precaution Comments: oxygen      Mobility Bed Mobility Overal bed mobility: Independent                  Transfers Overall transfer level: Independent                        Balance Overall balance assessment: No apparent balance deficits (not formally assessed)                                         ADL either  performed or assessed with clinical judgement   ADL Overall ADL's : At baseline                                             Vision Patient Visual Report: No change from baseline              Pertinent Vitals/Pain Pain Assessment Pain Assessment: No/denies pain     Hand Dominance Right   Extremity/Trunk Assessment Upper Extremity Assessment Upper Extremity Assessment: Overall WFL for tasks assessed   Lower Extremity Assessment Lower Extremity Assessment: Overall WFL for tasks assessed   Cervical / Trunk Assessment Cervical / Trunk Assessment: Kyphotic   Communication Communication Communication: No difficulties   Cognition Arousal/Alertness: Awake/alert Behavior During Therapy: Anxious, WFL for tasks assessed/performed Overall Cognitive Status: Within Functional Limits for tasks assessed                                                  Home Living Family/patient expects to be discharged to:: Private residence Living Arrangements: Spouse/significant other Available  Help at Discharge: Family Type of Home: House Home Access: Stairs to enter     Home Layout: One level;Able to live on main level with bedroom/bathroom     Bathroom Shower/Tub: Producer, television/film/video: Standard     Home Equipment: Agricultural consultant (2 wheels);Wheelchair - Psychologist, sport and exercise (4 wheels)          Prior Functioning/Environment Prior Level of Function : Independent/Modified Independent                                 OT Goals(Current goals can be found in the care plan section) Acute Rehab OT Goals Patient Stated Goal: home today OT Goal Formulation: With patient  OT Frequency:         AM-PAC OT "6 Clicks" Daily Activity     Outcome Measure Help from another person eating meals?: None Help from another person taking care of personal grooming?: None Help from another person toileting, which includes using  toliet, bedpan, or urinal?: None Help from another person bathing (including washing, rinsing, drying)?: None Help from another person to put on and taking off regular upper body clothing?: None Help from another person to put on and taking off regular lower body clothing?: None 6 Click Score: 24   End of Session Equipment Utilized During Treatment: Rolling walker (2 wheels) Nurse Communication: Mobility status  Activity Tolerance: Patient tolerated treatment well Patient left: in chair;with call bell/phone within reach                   Time: 1330-1350 OT Time Calculation (min): 20 min Charges:  OT General Charges $OT Visit: 1 Visit OT Evaluation $OT Eval Low Complexity: 1 Low  Lise Auer, OT Acute Rehabilitation Services [phone] Office- [phone]     Derica Leiber, Karin Golden D 04/02/2022, 3:39 PM

## 2022-04-02 NOTE — TOC CM/SW Note (Signed)
  Transition of Care [facility]) Screening Note   Patient Details  Name: Timothy Maldonado Date of Birth: Jun 09, 1951   Transition of Care [facility]) CM/SW Contact:    Ross Ludwig, LCSW Phone Number: 04/02/2022, 3:57 PM    Transition of Care Department [facility]) has reviewed patient and no TOC needs have been identified at this time. We will continue to monitor patient advancement through interdisciplinary progression rounds. If new patient transition needs arise, please place a TOC consult.

## 2022-04-02 NOTE — Evaluation (Signed)
Physical Therapy Evaluation Patient Details Name: Timothy Maldonado MRN: [id number] DOB: [birth year] Today's Date: 04/02/2022  History of Present Illness  Timothy Maldonado is a 71 y.o. male with medical history significant of COPD, hypertension, hyperlipidemia, anxiety, on 2 L of oxygen at home presented with shortness of breath for a week.  Hemodynamically stable on presentation but wheezing so admitted for observation.  Chest x-ray did not show pneumonia.  He feels symptomatically much better today after starting on steroids  Clinical Impression   Patient admitted for above medical problems. Patient reports feeling more shakey with "New Medications."  Patient  uses 2-2.5 LPM O2 at baseline, is independent in his home.   SPO2 955 on 3 L x 60'_ farther than he usually ambulates in his home at one time. HR up to 124.    Patient  hopes to DC today. No further PT needs.     Recommendations for follow up therapy are one component of a multi-disciplinary discharge planning process, led by the attending physician.  Recommendations may be updated based on patient status, additional functional criteria and insurance authorization.  Follow Up Recommendations No PT follow up      Assistance Recommended at Discharge PRN  Patient can return home with the following  Help with stairs or ramp for entrance;Assist for transportation;Assistance with cooking/housework    Equipment Recommendations None recommended by PT  Recommendations for Other Services       Functional Status Assessment Patient has not had a recent decline in their functional status     Precautions / Restrictions Precautions Precautions: Other (comment) Precaution Comments: oxygen      Mobility  Bed Mobility Overal bed mobility: Independent                  Transfers Overall transfer level: Independent                      Ambulation/Gait Ambulation/Gait assistance: Independent Gait Distance (Feet): 60  Feet   Gait Pattern/deviations: WFL(Within Functional Limits) Gait velocity: decr     General Gait Details: slow, pushes O2 tank  Stairs            Wheelchair Mobility    Modified Rankin (Stroke Patients Only)       Balance Overall balance assessment: No apparent balance deficits (not formally assessed)                                           Pertinent Vitals/Pain Pain Assessment Pain Assessment: No/denies pain    Home Living Family/patient expects to be discharged to:: Private residence Living Arrangements: Spouse/significant other Available Help at Discharge: Family Type of Home: House Home Access: Stairs to enter       Home Layout: One level;Able to live on main level with bedroom/bathroom Home Equipment: Rolling Walker (2 wheels);Wheelchair - Psychologist, sport and exercise (4 wheels)      Prior Function Prior Level of Function : Independent/Modified Independent                     Hand Dominance   Dominant Hand: Right    Extremity/Trunk Assessment   Upper Extremity Assessment Upper Extremity Assessment: Overall WFL for tasks assessed    Lower Extremity Assessment Lower Extremity Assessment: Overall WFL for tasks assessed    Cervical / Trunk Assessment Cervical / Trunk Assessment: Kyphotic  Communication  Communication: No difficulties  Cognition Arousal/Alertness: Awake/alert Behavior During Therapy: Anxious, WFL for tasks assessed/performed Overall Cognitive Status: Within Functional Limits for tasks assessed                                          General Comments      Exercises Other Exercises Other Exercises: IS instruction, 10 times every 4 hours per order   Assessment/Plan    PT Assessment Patient does not need any further PT services  PT Problem List         PT Treatment Interventions      PT Goals (Current goals can be found in the Care Plan section)  Acute Rehab PT  Goals Patient Stated Goal: home PT Goal Formulation: All assessment and education complete, DC therapy    Frequency       Co-evaluation               AM-PAC PT "6 Clicks" Mobility  Outcome Measure Help needed turning from your back to your side while in a flat bed without using bedrails?: None Help needed moving from lying on your back to sitting on the side of a flat bed without using bedrails?: None Help needed moving to and from a bed to a chair (including a wheelchair)?: None Help needed standing up from a chair using your arms (e.g., wheelchair or bedside chair)?: None Help needed to walk in hospital room?: None Help needed climbing 3-5 steps with a railing? : A Little 6 Click Score: 23    End of Session Equipment Utilized During Treatment: Oxygen Activity Tolerance: Patient tolerated treatment well;Treatment limited secondary to medical complications (Comment) Patient left: in bed Nurse Communication: Mobility status PT Visit Diagnosis: Difficulty in walking, not elsewhere classified (R26.2)    Time: 5809-9833 PT Time Calculation (min) (ACUTE ONLY): 33 min   Charges:   PT Evaluation $PT Eval Low Complexity: 1 Low PT Treatments $Gait Training: 8-22 mins        [place] Office [phone] Weekend [phone]   Claretha Cooper 04/02/2022, 2:45 PM

## 2022-04-02 NOTE — Discharge Summary (Signed)
Physician Discharge Summary  Timothy Maldonado FYB:[id number] DOB: Aug 04, 1950 DOA: 04/01/2022  PCP: Deland Pretty, MD  Admit date: 04/01/2022 Discharge date: 04/02/2022  Admitted From: Home Disposition:  Home  Discharge Condition:Stable CODE STATUS:FULL Diet recommendation: Heart Healthy   Brief/Interim Summary:  Timothy Maldonado is a 71 y.o. male with medical history significant of COPD, hypertension, hyperlipidemia, anxiety, on 2 L of oxygen at home presented with shortness of breath for a week.  Hemodynamically stable on presentation but wheezing so admitted for observation.  Chest x-ray did not show pneumonia.  He feels symptomatically much better today after starting on steroids.  Medically stable for discharge home today with a tapering dose of prednisone.  Following problems were addressed during his hospitalization:  COPD exacerbation: Presented with 1 week history of dyspnea, wheezing.  Chest x-ray did not show pneumonia.  Continue bronchodilators, steroids.  Started on Solu-Medrol. Started  on azithromycin,mucinex,duoneb,pulmicort. He follows with Dr. Silas Flood. He is a past smoker. Feels better today.  Free of wheezing.  Ambulated with physical therapist.  Stable for discharge back to home with 2 L, tapering prednisone   Acute on chronic hypoxic respiratory failure: On 2 L of oxygen at home.  Weaned   Hypertension: Takes hydrochlorothiazide, irbesartan at home.  Resumed   History of anxiety/adjustment disorder: On Xanax, sertraline   Hyperlipidemia: Zetia, Crestor.      Discharge Diagnoses:  Principal Problem:   COPD exacerbation ([place]) Active Problems:   HLD (hyperlipidemia)   Adjustment disorder with anxiety   Essential hypertension   Chronic respiratory failure with hypoxia [facility])    Discharge Instructions  Discharge Instructions     Diet - low sodium heart healthy   Complete by: As directed    Discharge instructions   Complete by: As directed    1)Please  take prescribed medications as instructed 2)Follow up with your PCP and pulmonologist as an outpatient   Increase activity slowly   Complete by: As directed       Allergies as of 04/02/2022       Reactions   Lipitor [atorvastatin] Other (See Comments)   Myalgias   Sulfonamide Derivatives Other (See Comments)   Childhood allergy unknown reaction         Medication List     TAKE these medications    albuterol 108 (90 Base) MCG/ACT inhaler Commonly known as: VENTOLIN HFA Inhale 2 puffs into the lungs every 4 (four) hours as needed for wheezing or shortness of breath.   albuterol (2.5 MG/3ML) 0.083% nebulizer solution Commonly known as: PROVENTIL Take 3 mLs (2.5 mg total) by nebulization every 4 (four) hours as needed for wheezing or shortness of breath.   ALPRAZolam 0.25 MG tablet Commonly known as: XANAX Take 0.25 mg by mouth 2 (two) times daily as needed for anxiety.   aspirin 81 MG tablet Take 81 mg by mouth daily.   azithromycin 500 MG tablet Commonly known as: ZITHROMAX Take 1 tablet (500 mg total) by mouth daily for 2 days. What changed:  medication strength how much to take   Bevespi Aerosphere 9-4.8 MCG/ACT Aero Generic drug: Glycopyrrolate-Formoterol Inhale 2 puffs into the lungs 2 (two) times daily.   Crestor 20 MG tablet Generic drug: rosuvastatin Take 20 mg by mouth daily.   ezetimibe 10 MG tablet Commonly known as: ZETIA Take 10 mg by mouth daily.   guaiFENesin 600 MG 12 hr tablet Commonly known as: MUCINEX Take 1 tablet (600 mg total) by mouth 2 (two) times daily for 5 days.  hydrochlorothiazide 12.5 MG capsule Commonly known as: MICROZIDE Take 12.5 mg by mouth daily.   ibuprofen 200 MG tablet Commonly known as: ADVIL Take 400 mg by mouth every 6 (six) hours as needed for mild pain, moderate pain or headache.   irbesartan 150 MG tablet Commonly known as: AVAPRO Take 150 mg by mouth daily after supper.   OXYGEN Inhale 2 L into the  lungs See admin instructions. Continuously   predniSONE 10 MG tablet Commonly known as: DELTASONE Take 1 tablet (10 mg total) by mouth daily. Take 4 pills daily for 3 days then 2 pills daily for 3 days then 1 pill daily for 3 days then stop   sertraline 50 MG tablet Commonly known as: ZOLOFT Take 25-50 mg by mouth See admin instructions. Take 1/2 tablet (25 mg) by mouth daily for 4 days then take 1 whole tablet (50 mg) daily        Follow-up Information     [name], MD. Schedule an appointment as soon as possible for a visit in 1 week(s).   Specialty: Internal Medicine Contact information: [address] [place] [address] [place] [address] [phone]                Allergies  Allergen Reactions   Lipitor [Atorvastatin] Other (See Comments)    Myalgias   Sulfonamide Derivatives Other (See Comments)    Childhood allergy unknown reaction     Consultations: None   Procedures/Studies: DG Chest Port 1 View  Result Date: 04/01/2022 CLINICAL DATA:  Shortness of breath EXAM: PORTABLE CHEST 1 VIEW COMPARISON:  chest x-ray dated March 15, 2022 FINDINGS: Cardiac and mediastinal contours within normal limits. Lungs are clear. No pleural effusion or pneumothorax. IMPRESSION: No active disease. Electronically Signed   By: Allegra Lai M.D.   On: 04/01/2022 12:19   DG Chest Portable 1 View  Result Date: 03/15/2022 CLINICAL DATA:  71 year old male with history of shortness of breath and difficulty breathing. EXAM: PORTABLE CHEST 1 VIEW COMPARISON:  Chest x-ray 12/28/2020. FINDINGS: Lung volumes are increased with emphysematous changes. No consolidative airspace disease. No pleural effusions. No pneumothorax. No pulmonary nodule or mass noted. Pulmonary vasculature and the cardiomediastinal silhouette are within normal limits. IMPRESSION: 1. No radiographic evidence of acute cardiopulmonary disease. 2. Emphysema. Electronically Signed   By: Trudie Reed M.D.   On:  03/15/2022 07:31      Subjective: Patient seen and examined the bedside today.  Hemodynamically stable.  He feels better.  He is eager to go home.  Auscultation did not reveal significant wheezing today.  Discharge Exam: Vitals:   04/02/22 0900 04/02/22 1139  BP: 139/67 127/67  Pulse: (!) 107 98  Resp: 16 16  Temp: 98.6 F (37 C) 98.6 F (37 C)  SpO2: 97% 97%   Vitals:   04/02/22 0551 04/02/22 0737 04/02/22 0900 04/02/22 1139  BP: 130/69  139/67 127/67  Pulse: 80  (!) 107 98  Resp: 16  16 16   Temp: 98.3 F (36.8 C)  98.6 F (37 C) 98.6 F (37 C)  TempSrc: Oral  Oral Oral  SpO2: 97% 98% 97% 97%  Weight:      Height:        General: Pt is alert, awake, not in acute distress Cardiovascular: RRR, S1/S2 +, no rubs, no gallops Respiratory: CTA bilaterally, no wheezing, no rhonchi Abdominal: Soft, NT, ND, bowel sounds + Extremities: no edema, no cyanosis    The results of significant diagnostics from this hospitalization (  including imaging, microbiology, ancillary and laboratory) are listed below for reference.     Microbiology: No results found for this or any previous visit (from the past 240 hour(s)).   Labs: BNP (last 3 results) Recent Labs    03/15/22 0742 04/01/22 1225  BNP 87.2 84.8   Basic Metabolic Panel: Recent Labs  Lab 04/01/22 1225  NA 140  K 4.0  CL 99  CO2 33*  GLUCOSE 122*  BUN 20  CREATININE 0.88  CALCIUM 9.1   Liver Function Tests: Recent Labs  Lab 04/01/22 1225  AST 22  ALT 28  ALKPHOS 81  BILITOT 0.9  PROT 6.9  ALBUMIN 3.7   No results for input(s): "LIPASE", "AMYLASE" in the last 168 hours. No results for input(s): "AMMONIA" in the last 168 hours. CBC: Recent Labs  Lab 04/01/22 1225  WBC 11.3*  NEUTROABS 9.5*  HGB 13.7  HCT 43.7  MCV 95.0  PLT 265   Cardiac Enzymes: No results for input(s): "CKTOTAL", "CKMB", "CKMBINDEX", "TROPONINI" in the last 168 hours. BNP: Invalid input(s): "POCBNP" CBG: No results for  input(s): "GLUCAP" in the last 168 hours. D-Dimer No results for input(s): "DDIMER" in the last 72 hours. Hgb A1c No results for input(s): "HGBA1C" in the last 72 hours. Lipid Profile No results for input(s): "CHOL", "HDL", "LDLCALC", "TRIG", "CHOLHDL", "LDLDIRECT" in the last 72 hours. Thyroid function studies No results for input(s): "TSH", "T4TOTAL", "T3FREE", "THYROIDAB" in the last 72 hours.  Invalid input(s): "FREET3" Anemia work up No results for input(s): "VITAMINB12", "FOLATE", "FERRITIN", "TIBC", "IRON", "RETICCTPCT" in the last 72 hours. Urinalysis No results found for: "COLORURINE", "APPEARANCEUR", "LABSPEC", "PHURINE", "GLUCOSEU", "HGBUR", "BILIRUBINUR", "KETONESUR", "PROTEINUR", "UROBILINOGEN", "NITRITE", "LEUKOCYTESUR" Sepsis Labs Recent Labs  Lab 04/01/22 1225  WBC 11.3*   Microbiology No results found for this or any previous visit (from the past 240 hour(s)).  Please note: You were cared for by a hospitalist during your hospital stay. Once you are discharged, your primary care physician will handle any further medical issues. Please note that NO REFILLS for any discharge medications will be authorized once you are discharged, as it is imperative that you return to your primary care physician (or establish a relationship with a primary care physician if you do not have one) for your post hospital discharge needs so that they can reassess your need for medications and monitor your lab values.    Time coordinating discharge: 40 minutes  SIGNED:   Burnadette Pop, MD  [facility] 04/02/2022, 1:03 PM Pager [phone]  If 7PM-7AM, please contact night-coverage [email or web address] Password [email or web address]

## 2022-04-02 NOTE — Progress Notes (Signed)
Initial Nutrition Assessment  INTERVENTION:   -Ensure Plus High Protein po BID, each supplement provides 350 kcal and 20 grams of protein.   -Multivitamin with minerals daily  NUTRITION DIAGNOSIS:   Increased nutrient needs related to chronic illness (COPD) as evidenced by estimated needs.  GOAL:   Patient will meet greater than or equal to 90% of their needs  MONITOR:   PO intake, Supplement acceptance, Labs, Weight trends, I & O's  REASON FOR ASSESSMENT:   Consult Assessment of nutrition requirement/status  ASSESSMENT:   71 y.o. male with medical history significant of COPD, hypertension, hyperlipidemia, anxiety, on 2 L of oxygen at home presented with shortness of breath for a week.  Patient reporting worsening SOB over the past week. Does not report this impacts his appetite. Given increased needs, will order Ensure supplements and daily MVI.  Per weight records, pt's weight has increased since 10/3.   Medications reviewed.  Labs reviewed.  NUTRITION - FOCUSED PHYSICAL EXAM:  Unable to complete, working remotely.  Diet Order:   Diet Order             Diet Heart Room service appropriate? Yes; Fluid consistency: Thin  Diet effective now                   EDUCATION NEEDS:   Not appropriate for education at this time  Skin:  Skin Assessment: Reviewed RN Assessment  Last BM:  10/19  Height:   Ht Readings from Last 1 Encounters:  04/01/22 5\' 10"  (1.778 m)    Weight:   Wt Readings from Last 1 Encounters:  04/01/22 68 kg    BMI:  Body mass index is 21.52 kg/m.  Estimated Nutritional Needs:   Kcal:  2050-2250  Protein:  105-115g  Fluid:  2L/day  Clayton Bibles, MS, RD, LDN Inpatient Clinical Dietitian Contact information available via Amion

## 2022-04-03 DIAGNOSIS — J9611 Chronic respiratory failure with hypoxia: Secondary | ICD-10-CM | POA: Diagnosis not present

## 2022-05-04 DIAGNOSIS — J9611 Chronic respiratory failure with hypoxia: Secondary | ICD-10-CM | POA: Diagnosis not present

## 2022-05-17 ENCOUNTER — Ambulatory Visit: Payer: Medicare HMO | Admitting: Pulmonary Disease

## 2022-06-03 DIAGNOSIS — J9611 Chronic respiratory failure with hypoxia: Secondary | ICD-10-CM | POA: Diagnosis not present

## 2022-06-20 ENCOUNTER — Other Ambulatory Visit: Payer: Medicare HMO

## 2022-06-21 ENCOUNTER — Telehealth: Payer: Medicare HMO | Admitting: Pulmonary Disease

## 2022-06-23 ENCOUNTER — Other Ambulatory Visit: Payer: Medicare HMO

## 2022-07-04 DIAGNOSIS — J9611 Chronic respiratory failure with hypoxia: Secondary | ICD-10-CM | POA: Diagnosis not present

## 2022-07-14 ENCOUNTER — Telehealth: Payer: Medicare HMO | Admitting: Pulmonary Disease

## 2022-07-27 ENCOUNTER — Telehealth (INDEPENDENT_AMBULATORY_CARE_PROVIDER_SITE_OTHER): Payer: Medicare HMO | Admitting: Pulmonary Disease

## 2022-07-27 DIAGNOSIS — J449 Chronic obstructive pulmonary disease, unspecified: Secondary | ICD-10-CM

## 2022-07-27 DIAGNOSIS — Z87891 Personal history of nicotine dependence: Secondary | ICD-10-CM | POA: Diagnosis not present

## 2022-07-27 DIAGNOSIS — J9611 Chronic respiratory failure with hypoxia: Secondary | ICD-10-CM | POA: Diagnosis not present

## 2022-07-27 NOTE — Progress Notes (Signed)
Virtual Visit via Telephone Note  I connected with Timothy Maldonado on 08/04/22 at  2:15 PM EST by telephone and verified that I am speaking with the correct person using two identifiers.  Location: Patient: Home Provider: [facility] - [id number], [place], [place], [place] [address]   I discussed the limitations, risks, security and privacy concerns of performing an evaluation and management service by telephone and the availability of in person appointments. I also discussed with the patient that there may be a patient responsible charge related to this service. The patient expressed understanding and agreed to proceed.  Patient consented to consult via telephone: Yes People present and their role in pt care: Pt     History of Present Illness:  Doing ok. Good adherence to bevespi. Thinks this is somewhat helpful. Better than prior inhaler. Did have ED visit 03/2022 for COPD exacerbation. Briefly admitted. Treated with steroids. Improved. CXR reviewed, interpreted as clear lungs, hyperinflated. Improved and discharged on steroid taper.    Chief complaint:   DOE  Observations/Objective:  Social History   Tobacco Use  Smoking Status Former   Packs/day: 1.00   Years: 48.00   Total pack years: 48.00   Types: Cigarettes   Quit date: 03/10/2020   Years since quitting: 2.4  Smokeless Tobacco Never   Immunization History  Administered Date(s) Administered   Influenza, Quadrivalent, Recombinant, Inj, Pf 03/03/2017, 02/09/2018, 02/20/2019, 03/03/2020, 03/16/2021   Influenza-Unspecified 03/07/2012, 04/01/2014, 02/25/2015, 04/06/2016, 03/03/2020   PFIZER(Purple Top)SARS-COV-2 Vaccination 07/28/2019, 08/20/2019, 03/28/2020   Pneumococcal Conjugate-13 01/28/2016   Pneumococcal Polysaccharide-23 11/27/2013   Td 01/01/2007   Tdap 08/31/2011   Zoster, Live 01/12/2015      Assessment and Plan:  Severe COPD: Based on PFTs 2016.  With hyperinflation.  Anoro not  helpful.  Trial Bevespi, prescribed last visit, seems with improved symptoms.  Recommended using albuterol nebs at home for rescue and continue HFA use when outside the home.   Chronic hypoxemic respiratory failure: In setting of emphysema, COPD.  Counseled this will not improve and he will need oxygen for the rest of his life.  Qualified for POC at last visit, no issues.Marland Kitchen  He was encouraged to keep his oxygen saturation 88% or higher.    Dyspnea on exertion: Likely multifactorial related to severe COPD, valvular disease, deconditioning.  Consider pulmonary rehab in the future.  Follow Up Instructions:  Return in about 6 months (around 01/25/2023).   I discussed the assessment and treatment plan with the patient. The patient was provided an opportunity to ask questions and all were answered. The patient agreed with the plan and demonstrated an understanding of the instructions.   The patient was advised to call back or seek an in-person evaluation if the symptoms worsen or if the condition fails to improve as anticipated.  I provided 22 minutes of non-face-to-face time during this encounter.   Lanier Clam, MD

## 2022-08-04 ENCOUNTER — Encounter: Payer: Self-pay | Admitting: Pulmonary Disease

## 2022-08-04 DIAGNOSIS — J9611 Chronic respiratory failure with hypoxia: Secondary | ICD-10-CM | POA: Diagnosis not present

## 2022-08-10 ENCOUNTER — Ambulatory Visit: Payer: Medicare HMO | Admitting: Cardiology

## 2022-08-13 ENCOUNTER — Other Ambulatory Visit: Payer: Self-pay | Admitting: Pulmonary Disease

## 2022-09-02 DIAGNOSIS — J9611 Chronic respiratory failure with hypoxia: Secondary | ICD-10-CM | POA: Diagnosis not present

## 2022-09-13 ENCOUNTER — Other Ambulatory Visit: Payer: Self-pay | Admitting: Pulmonary Disease

## 2022-10-03 DIAGNOSIS — J9611 Chronic respiratory failure with hypoxia: Secondary | ICD-10-CM | POA: Diagnosis not present

## 2022-10-14 IMAGING — DX DG CHEST 1V PORT
1 series · 1 of 1 positions shown · non-contrast
Comparison: Chest CT May 07, 2019

CLINICAL DATA: Shortness of breath

EXAM:
PORTABLE CHEST 1 VIEW

[chest ap]
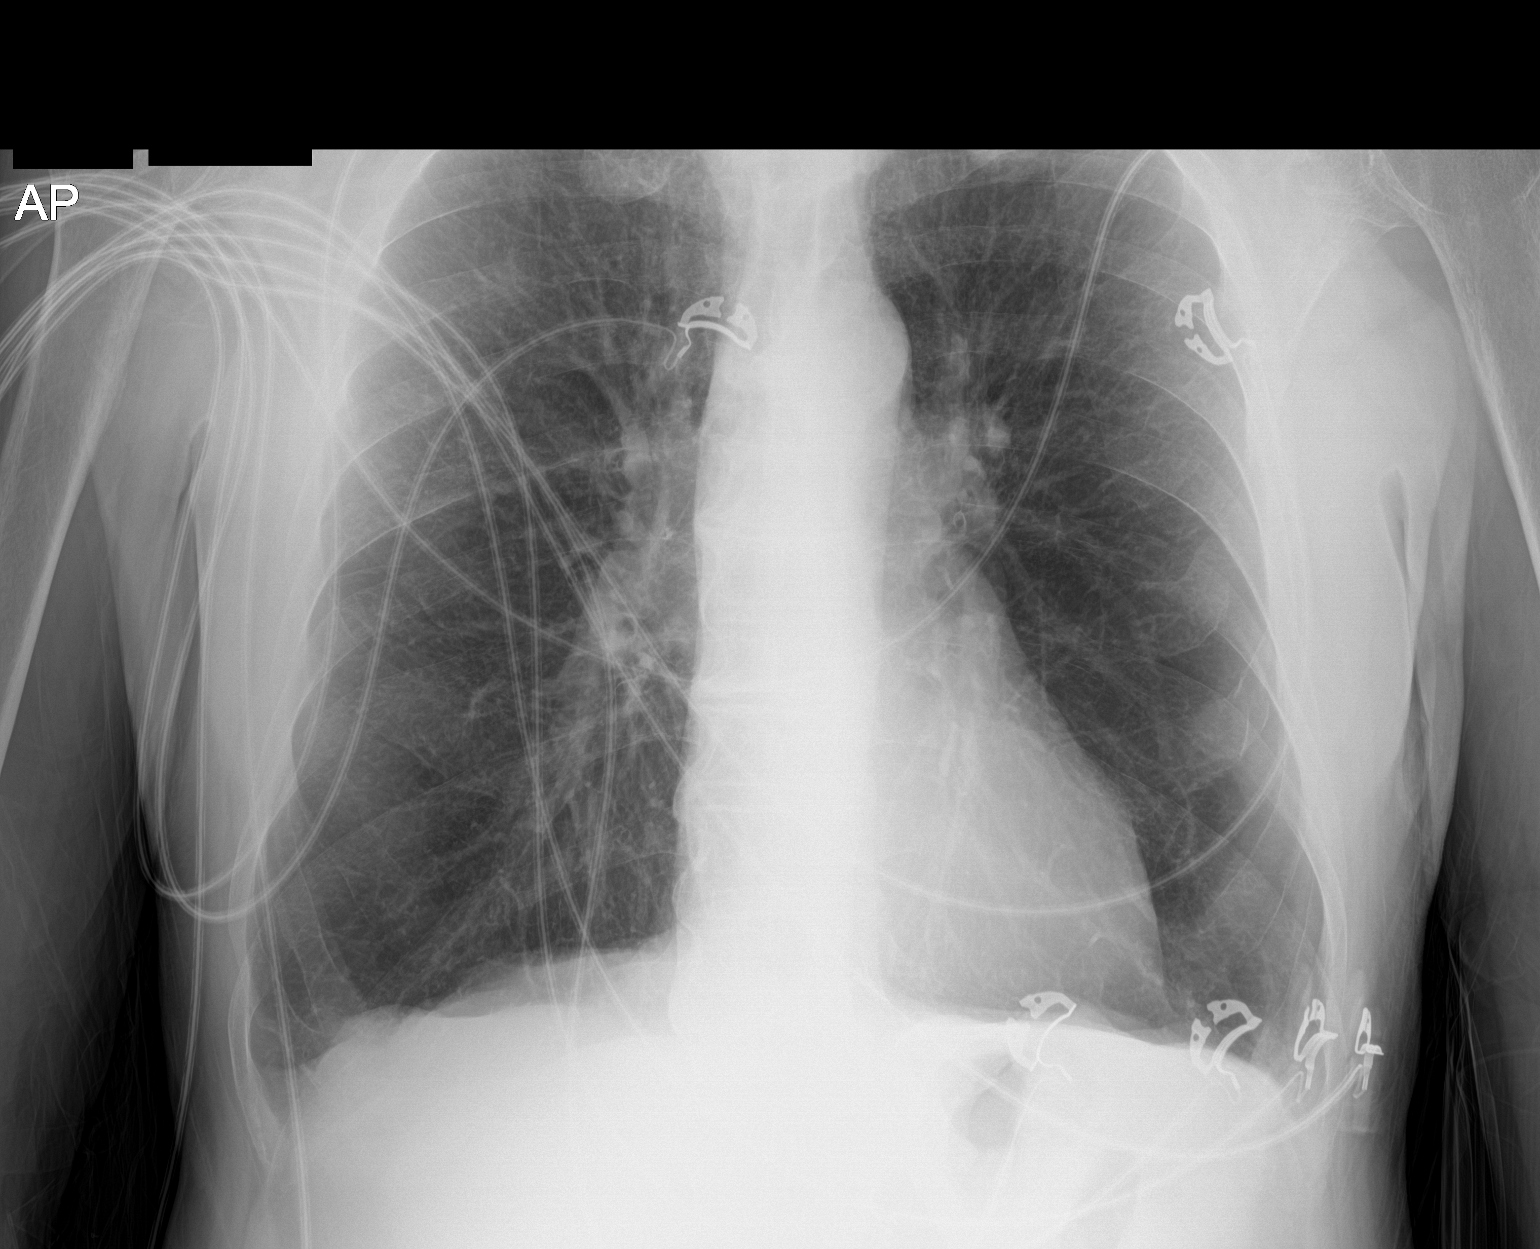

[1 of 1 positions shown; findings below may reference images not displayed]

FINDINGS: Lungs are hyperexpanded. Underlying emphysematous changes better
delineated on prior CT. No edema or airspace opacity. The heart size
and pulmonary vascularity are within normal limits. No adenopathy.
No bone lesions.
IMPRESSION: Lungs hyperexpanded. No edema or airspace opacity. Heart size
normal.

Emphysema ([id number]-[id number]).

## 2022-11-02 DIAGNOSIS — J9611 Chronic respiratory failure with hypoxia: Secondary | ICD-10-CM | POA: Diagnosis not present

## 2022-11-18 IMAGING — CT CT CHEST LUNG CANCER SCREENING LOW DOSE W/O CM
1 series · 10 of 10 positions shown, 13 images · non-contrast
Comparison: 05/07/2019.

CLINICAL DATA: Lung cancer screening. Former asymptomatic smoker.
Fifty-three pack-year history.

EXAM:
CT CHEST WITHOUT CONTRAST LOW-DOSE FOR LUNG CANCER SCREENING
TECHNIQUE: Multidetector CT imaging of the chest was performed following the
standard protocol without IV contrast.

[ct lung segmentation data · axial · 0.71mm/px · z∈[-77,-77]mm · 10 of 307 frames shown]
[frame 1/307  mediastinal]
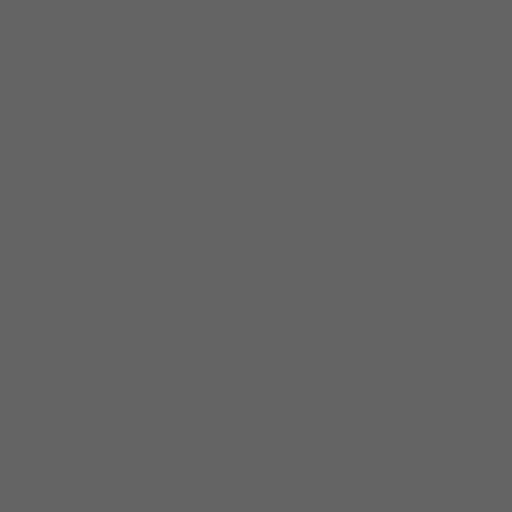
[frame 1/307  lung]
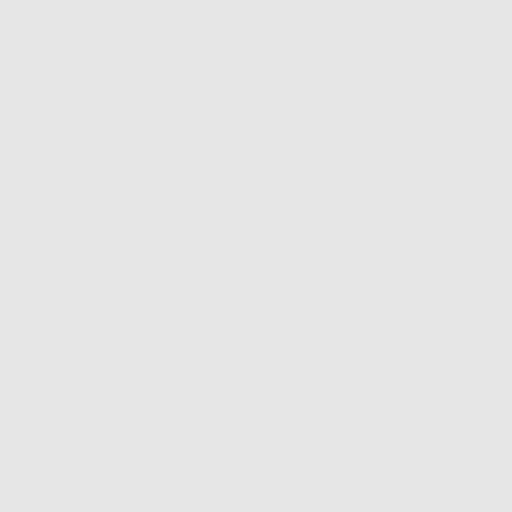
[frame 35/307  lung]
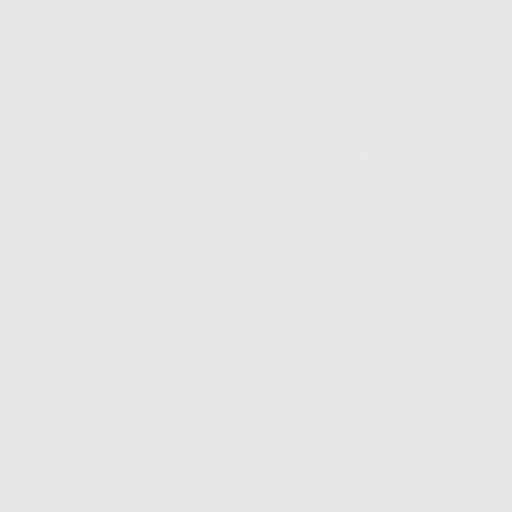
[frame 69/307  lung]
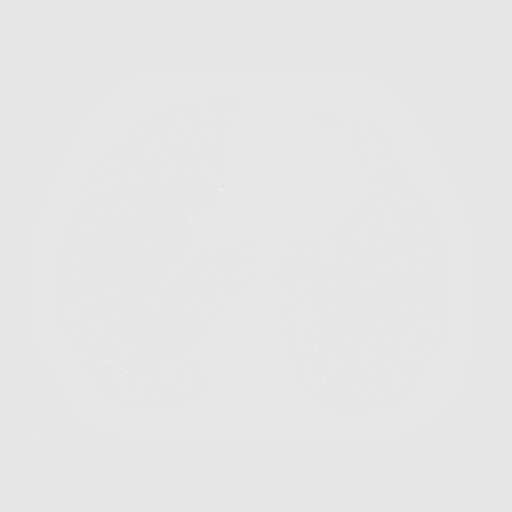
[frame 103/307  lung]
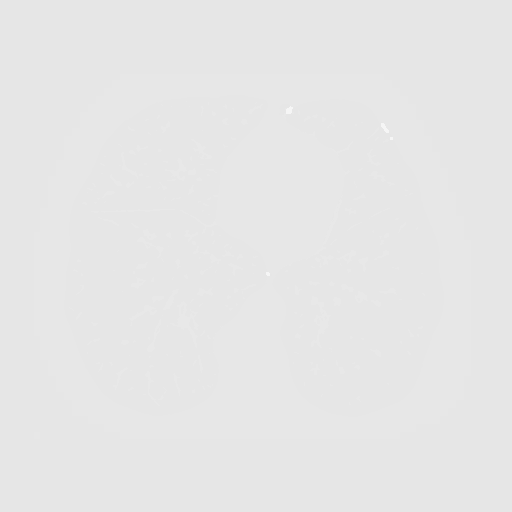
[frame 137/307  mediastinal]
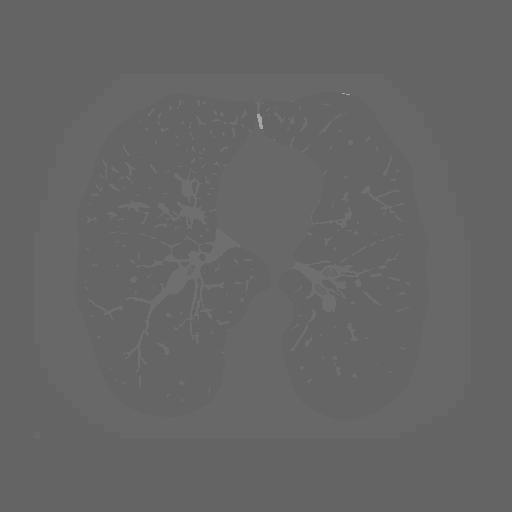
[frame 137/307  lung]
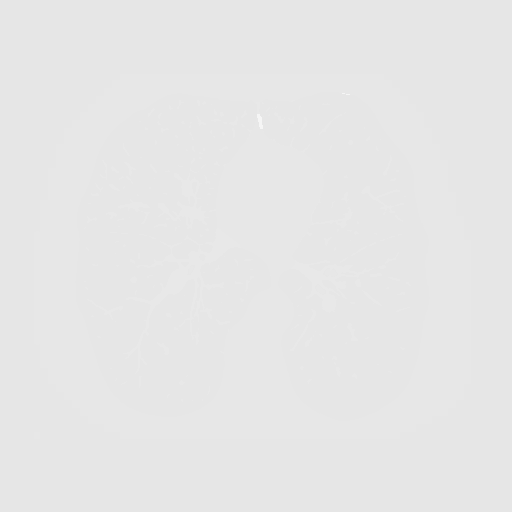
[frame 171/307  lung]
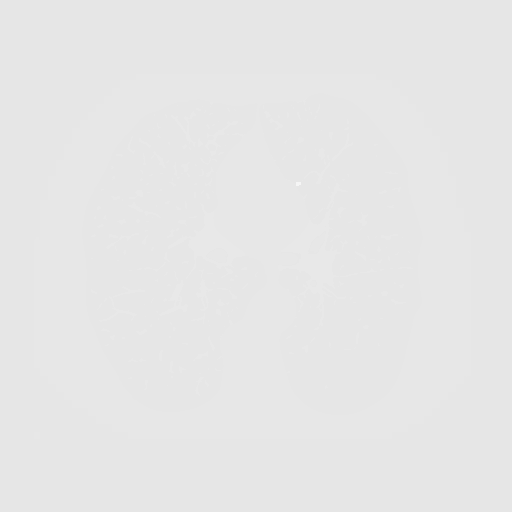
[frame 205/307  lung]
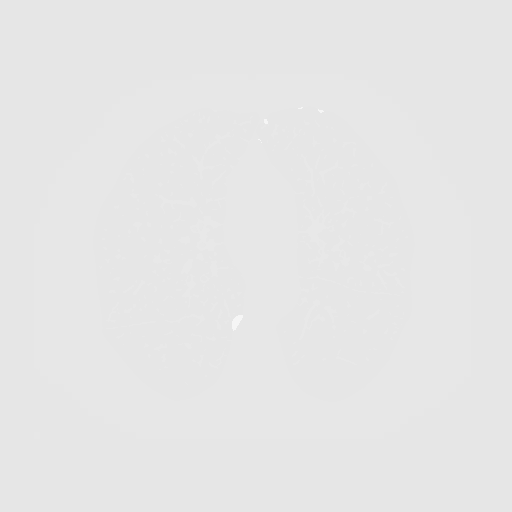
[frame 239/307  lung]
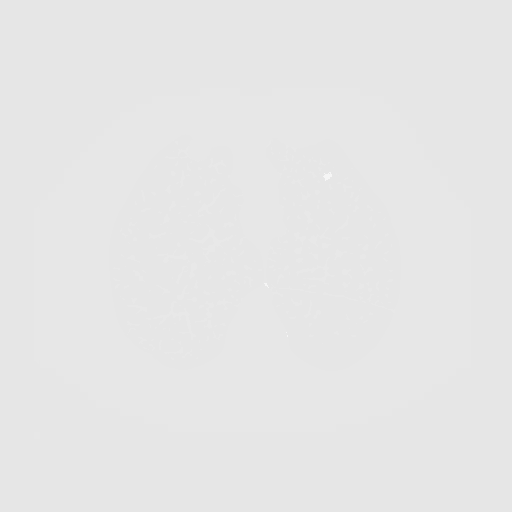
[frame 273/307  mediastinal]
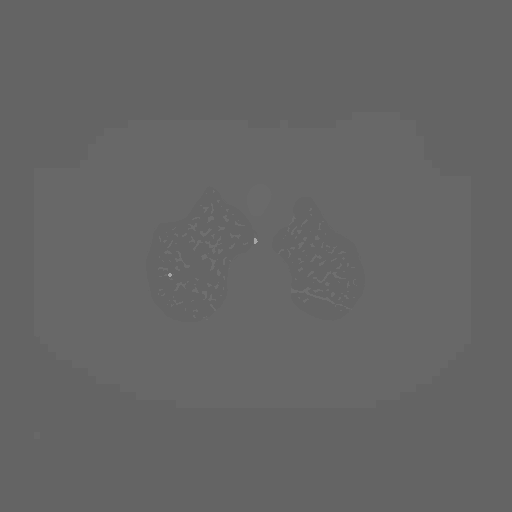
[frame 273/307  lung]
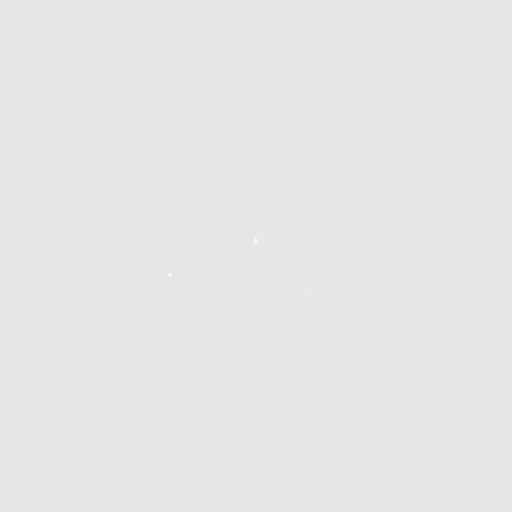
[frame 307/307  lung]
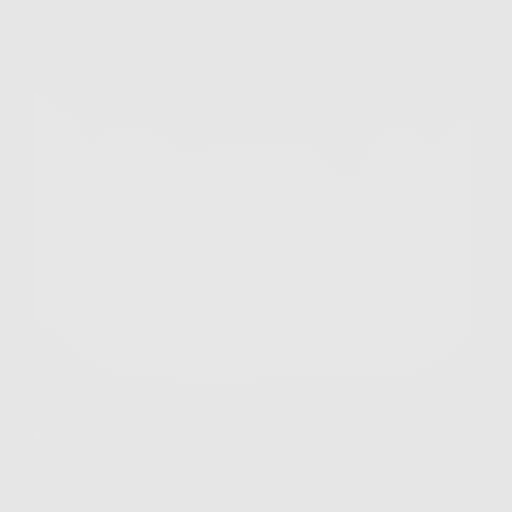

[10 of 10 positions shown; findings below may reference images not displayed]

FINDINGS: Cardiovascular: The heart size appears within normal limits. Aortic
atherosclerosis identified. Coronary artery calcifications. No
pericardial effusion.

Mediastinum/Nodes: No enlarged mediastinal, hilar, or axillary lymph
nodes. Thyroid gland, trachea, and esophagus demonstrate no
significant findings.

Lungs/Pleura: Moderate centrilobular and paraseptal emphysema. No
pleural effusion or edema. No airspace consolidation. Small lung
nodules are again noted and appear unchanged from previous exam. The
largest is in the lingula with an equivalent diameter of 3.8 mm. No
new lung nodules identified.

Upper Abdomen: No acute abnormality.

Musculoskeletal: No chest wall mass or suspicious bone lesions
identified. Degenerative disc disease noted within the thoracic
spine.
IMPRESSION: 1. Lung-RADS 2, benign appearance or behavior.
2. Emphysema and aortic atherosclerosis.
3. Coronary artery calcifications.

Aortic Atherosclerosis ([id number]-J57.7) and Emphysema ([id number]).

## 2022-12-03 DIAGNOSIS — J9611 Chronic respiratory failure with hypoxia: Secondary | ICD-10-CM | POA: Diagnosis not present

## 2023-01-02 DIAGNOSIS — J9611 Chronic respiratory failure with hypoxia: Secondary | ICD-10-CM | POA: Diagnosis not present

## 2023-02-02 DIAGNOSIS — J9611 Chronic respiratory failure with hypoxia: Secondary | ICD-10-CM | POA: Diagnosis not present

## 2023-03-05 DIAGNOSIS — J9611 Chronic respiratory failure with hypoxia: Secondary | ICD-10-CM | POA: Diagnosis not present

## 2023-04-04 ENCOUNTER — Other Ambulatory Visit: Payer: Self-pay | Admitting: Pulmonary Disease

## 2023-04-04 DIAGNOSIS — J9611 Chronic respiratory failure with hypoxia: Secondary | ICD-10-CM | POA: Diagnosis not present

## 2023-05-05 DIAGNOSIS — J9611 Chronic respiratory failure with hypoxia: Secondary | ICD-10-CM | POA: Diagnosis not present

## 2023-06-04 DIAGNOSIS — J9611 Chronic respiratory failure with hypoxia: Secondary | ICD-10-CM | POA: Diagnosis not present

## 2023-07-05 DIAGNOSIS — J9611 Chronic respiratory failure with hypoxia: Secondary | ICD-10-CM | POA: Diagnosis not present

## 2023-08-05 DIAGNOSIS — J9611 Chronic respiratory failure with hypoxia: Secondary | ICD-10-CM | POA: Diagnosis not present

## 2023-09-02 DIAGNOSIS — J9611 Chronic respiratory failure with hypoxia: Secondary | ICD-10-CM | POA: Diagnosis not present

## 2023-10-03 DIAGNOSIS — J9611 Chronic respiratory failure with hypoxia: Secondary | ICD-10-CM | POA: Diagnosis not present

## 2023-10-05 ENCOUNTER — Encounter: Payer: Self-pay | Admitting: *Deleted

## 2023-11-02 DIAGNOSIS — J9611 Chronic respiratory failure with hypoxia: Secondary | ICD-10-CM | POA: Diagnosis not present

## 2023-11-19 ENCOUNTER — Other Ambulatory Visit: Payer: Self-pay | Admitting: Pulmonary Disease

## 2023-11-20 NOTE — Telephone Encounter (Signed)
 Copied from CRM [id number]. Topic: Clinical - Medication Refill >> Nov 20, 2023  2:54 PM Hilton Lucky wrote: Medication: Glycopyrrolate-Formoterol (BEVESPI  AEROSPHERE) 9-4.8 MCG/ACT AERO  Has the patient contacted their pharmacy? Yes - Pharmacy told him that the provider had cancelled all his medications and would not refill them going forward. Patient was upset. Patient intends to schedule appointment as soon as grand daughter is able to assist him with coming to the appointment, as patient is not able to leave the house alone.  This is the patient's preferred pharmacy:  [facility] [address] [place] - [address] [place] [place] [address] Phone: [phone] Fax: [phone]  Is this the correct pharmacy for this prescription? Yes If no, delete pharmacy and type the correct one.   Has the prescription been filled recently? No  Is the patient out of the medication? Yes  Has the patient been seen for an appointment in the last year OR does the patient have an upcoming appointment? No - Intends to schedule as soon as transport is arranged.   Can we respond through MyChart? Yes - Patient requesting a call to discuss.   Agent: Please be advised that Rx refills may take up to 3 business days. We ask that you follow-up with your pharmacy.  Additionally advised patient that provider would not cease providing care to the patient without notice, as pharmacy indicated patient's prescription shad been cancelled (not denied) with implications to being unable to have them filled in the future as well.  Patient is aware that he will need to schedule an appointment. Patient is requesting a temporary refill of the medication until he is able to secure transport through his family and have an appointment scheduled that corresponds with their schedules as well.   Please advise patient if medication cannot be refilled on next steps or alternatives until he can be seen.

## 2023-11-21 ENCOUNTER — Other Ambulatory Visit: Payer: Self-pay | Admitting: Pulmonary Disease

## 2023-11-22 ENCOUNTER — Telehealth: Payer: Self-pay

## 2023-11-22 DIAGNOSIS — J449 Chronic obstructive pulmonary disease, unspecified: Secondary | ICD-10-CM

## 2023-11-22 MED ORDER — BEVESPI AEROSPHERE 9-4.8 MCG/ACT IN AERO: 2.0000 | INHALATION_SPRAY | Freq: Two times a day (BID) | RESPIRATORY_TRACT | 1 refills | Status: DC

## 2023-11-22 NOTE — Telephone Encounter (Signed)
 Copied from CRM [id number]. Topic: Clinical - Prescription Issue >> Nov 21, 2023 10:37 AM Ambrose Junk wrote: Reason for CRM: Patient states pharmacy call and  said Dr Marygrace Snellen denied filling his script. Patient states he does need to make an appt, but will make appt tomorrow when he gets with his wife. Continue to state he needs inhaler today. Patient would like a call from office to discuss. >> Nov 21, 2023 10:50 AM Laurina Popper O wrote: Patient called back and hung up and said she would call the office tried to explain to the lady that I could help her . And she hung up please give patient wife I believe a call back . [phone]  Spoke with patient regarding prior message. Patient has asked me to contact his wife to schedule a office visit for patient to be able to get a refill on his inhaler.   Spoke with patient's wife scheduled a office visit with Dr.Hunsucker on 12/12/2023 for refill on medication. Patient's wife's voice was understanding.Nothing else further needed.

## 2023-11-23 DIAGNOSIS — Z125 Encounter for screening for malignant neoplasm of prostate: Secondary | ICD-10-CM | POA: Diagnosis not present

## 2023-11-23 DIAGNOSIS — I10 Essential (primary) hypertension: Secondary | ICD-10-CM | POA: Diagnosis not present

## 2023-11-23 DIAGNOSIS — E7800 Pure hypercholesterolemia, unspecified: Secondary | ICD-10-CM | POA: Diagnosis not present

## 2023-11-29 DIAGNOSIS — J439 Emphysema, unspecified: Secondary | ICD-10-CM | POA: Diagnosis not present

## 2023-11-29 DIAGNOSIS — I6529 Occlusion and stenosis of unspecified carotid artery: Secondary | ICD-10-CM | POA: Diagnosis not present

## 2023-11-29 DIAGNOSIS — I2510 Atherosclerotic heart disease of native coronary artery without angina pectoris: Secondary | ICD-10-CM | POA: Diagnosis not present

## 2023-11-29 DIAGNOSIS — J9611 Chronic respiratory failure with hypoxia: Secondary | ICD-10-CM | POA: Diagnosis not present

## 2023-11-29 DIAGNOSIS — I700 Atherosclerosis of aorta: Secondary | ICD-10-CM | POA: Diagnosis not present

## 2023-11-29 DIAGNOSIS — I10 Essential (primary) hypertension: Secondary | ICD-10-CM | POA: Diagnosis not present

## 2023-11-29 DIAGNOSIS — F419 Anxiety disorder, unspecified: Secondary | ICD-10-CM | POA: Diagnosis not present

## 2023-11-29 DIAGNOSIS — Z0000 Encounter for general adult medical examination without abnormal findings: Secondary | ICD-10-CM | POA: Diagnosis not present

## 2023-11-29 DIAGNOSIS — R748 Abnormal levels of other serum enzymes: Secondary | ICD-10-CM | POA: Diagnosis not present

## 2023-11-29 DIAGNOSIS — N400 Enlarged prostate without lower urinary tract symptoms: Secondary | ICD-10-CM | POA: Diagnosis not present

## 2023-11-29 DIAGNOSIS — R636 Underweight: Secondary | ICD-10-CM | POA: Diagnosis not present

## 2023-11-29 DIAGNOSIS — I712 Thoracic aortic aneurysm, without rupture, unspecified: Secondary | ICD-10-CM | POA: Diagnosis not present

## 2023-11-29 DIAGNOSIS — F32A Depression, unspecified: Secondary | ICD-10-CM | POA: Diagnosis not present

## 2023-11-30 LAB — LAB REPORT - SCANNED: EGFR: 97

## 2023-12-03 DIAGNOSIS — J9611 Chronic respiratory failure with hypoxia: Secondary | ICD-10-CM | POA: Diagnosis not present

## 2023-12-12 ENCOUNTER — Encounter: Payer: Self-pay | Admitting: Emergency Medicine

## 2023-12-12 ENCOUNTER — Encounter: Payer: Self-pay | Admitting: Pulmonary Disease

## 2023-12-12 ENCOUNTER — Ambulatory Visit: Admitting: Pulmonary Disease

## 2023-12-12 VITALS — BP 159/80 | HR 96 | Ht 70.0 in | Wt 129.0 lb

## 2023-12-12 DIAGNOSIS — Z720 Tobacco use: Secondary | ICD-10-CM

## 2023-12-12 DIAGNOSIS — J449 Chronic obstructive pulmonary disease, unspecified: Secondary | ICD-10-CM | POA: Diagnosis not present

## 2023-12-12 MED ORDER — ALBUTEROL SULFATE HFA 108 (90 BASE) MCG/ACT IN AERS: 2.0000 | INHALATION_SPRAY | RESPIRATORY_TRACT | 11 refills | Status: AC | PRN

## 2023-12-12 MED ORDER — BEVESPI AEROSPHERE 9-4.8 MCG/ACT IN AERO: 2.0000 | INHALATION_SPRAY | Freq: Two times a day (BID) | RESPIRATORY_TRACT | 12 refills | Status: AC

## 2023-12-12 NOTE — Patient Instructions (Signed)
 I am glad you are doing well  The Bevespi  and the albuterol  refilled today  No change in the medication  Will get an order in for the oxygen  tank to have in addition to the portable oxygen  concentrator  We will get you reenrolled in lung cancer screening.  Return to clinic in 1 year or sooner as needed with Dr. Annella

## 2023-12-12 NOTE — Progress Notes (Signed)
 @Patient  ID: Timothy Maldonado, male    DOB: Aug 07, 1950, 73 y.o.   MRN: [id number]  Chief Complaint  Patient presents with   Follow-up    Referring provider: Clarice Nottingham, MD  HPI:   73 y.o. man whom I am seeing to establish care for severe COPD and chronic hypoxemic respiratory failure related to emphysema/COPD. Recent telephone encounter from pulmonary office reviewed.  Doing well.  Exercising more.  More active now.  Continue on POC.  Wants to pay for tank to have as backup just in case.  Will send order today.  Continue inhaler regimen.  Stable.  No exacerbations.  HPI at initial visit: Patient wanted history of tobacco abuse.  Now in remission for over a year.  He was congratulated for this.  In the summer 2022 was admitted with hypoxic respiratory failure.  Placed on oxygen  at that time.  Has continued since then.  In the interim has noted oxygen  desaturation at night so using 2 L at night.  He was again hospitalized 12/2021 at least once daily.  At that time chest imaging consistent with pneumonia.  He was treated for pneumonia with antibiotics.    He has been on Anoro therapy.  Does not show much is helped.  He has frequent exacerbations or attacks of shortness of breath.  Cannot use albuterol  HFA effectively at this time.  In the past he is use the nebulizer with some mild improvement.  He states currently his nebulizer machine is in the closet.  Has not used in some time.  He does find Xanax  to be particular beneficial.  He admits that this drives a lot of situational anxiety and the Xanax  helps.  Reviewed most recent CT scans 12/2020 on my review and interpretation reveals significant diffuse emphysema as well as scattered inflammatory nodules changes particularly in the right middle lobe.  Repeat high-res CT scan 06/2021 on my review and interpretation shows resolution of these inflammatory or nodular changes with persistence and consistent diffuse significant emphysematous changes with  hyperinflation.  He is frustrated with oxygen  company.  Trying to get portable oxygen  concentrator but has been unable to date.   Questionaires / Pulmonary Flowsheets:   ACT:      No data to display          MMRC:     No data to display          Epworth:      No data to display          Tests:   FENO:  No results found for: NITRICOXIDE  PFT:    Latest Ref Rng & Units 01/12/2015   11:01 AM  PFT Results  FVC-Pre L 3.32   FVC-Predicted Pre % 73   Pre FEV1/FVC % % 35   FEV1-Pre L 1.17   FEV1-Predicted Pre % 34   DLCO uncorrected ml/min/mmHg 16.89   DLCO UNC% % 54   DLVA Predicted % 63   TLC L 7.20   TLC % Predicted % 105   RV % Predicted % 143   Personally reviewed and interpreted as spirometry consistent with severe fixed obstruction with FEV1 34% predicted, lung volumes consistent with air trapping, DLCO moderately reduced.  WALK:     05/20/2021   12:56 PM 05/22/2020   11:40 AM 04/21/2020   11:47 AM  SIX MIN WALK  Supplimental Oxygen  during Test? (L/min) Yes No No  O2 Flow Rate 2 L/min    Type Continuous  Tech Comments: average pace/stopped once to rest due to SOB/stopped due to SOB/lmr average pace/min SOB//lmr average pace/SOB last lap//lmr    Imaging: Personally reviewed and as per EMR No results found.  Lab Results: Personally reviewed CBC    Component Value Date/Time   WBC 11.3 (H) 04/01/2022 1225   RBC 4.60 04/01/2022 1225   HGB 13.7 04/01/2022 1225   HCT 43.7 04/01/2022 1225   PLT 265 04/01/2022 1225   MCV 95.0 04/01/2022 1225   MCH 29.8 04/01/2022 1225   MCHC 31.4 04/01/2022 1225   RDW 13.2 04/01/2022 1225   LYMPHSABS 1.1 04/01/2022 1225   MONOABS 0.4 04/01/2022 1225   EOSABS 0.2 04/01/2022 1225   BASOSABS 0.1 04/01/2022 1225    BMET    Component Value Date/Time   NA 140 04/01/2022 1225   K 4.0 04/01/2022 1225   CL 99 04/01/2022 1225   CO2 33 (H) 04/01/2022 1225   GLUCOSE 122 (H) 04/01/2022 1225   BUN 20  04/01/2022 1225   CREATININE 0.88 04/01/2022 1225   CALCIUM  9.1 04/01/2022 1225   GFRNONAA >60 04/01/2022 1225   GFRAA 129 08/11/2008 1122    BNP    Component Value Date/Time   BNP 84.8 04/01/2022 1225    ProBNP No results found for: PROBNP  Specialty Problems       Pulmonary Problems   COUGH   Qualifier: Diagnosis of  By: Amon MD, Aloysius DRAFTS       COPD  GOLD III/ MZ   Followed in Pulmonary clinic/ [facility]/ Wert Quit smoking 03/2020 / MZ  - PFT's  01/22/15 FEV1 1.17 (34 % ) ratio 0.35    p 0 prior to study with DLCO  16.89 (54%) corrects to 2.89 (63%)  for alv volume and FV curve classic concavity   -  04/21/2020:   alpha one AT phenotype  >>>  MZ   Level 116  -  05/22/2020   Walked RA  2 laps @ approx 278ft each @ avg pace  stopped due to end of study,  Min sob with sats 92%       Respiratory failure, acute (HCC)   See admit 04/07/20  -  04/21/2020   Walked RA  2 laps @ approx 274ft each @ avg pace  stopped due to end of study,  Sob on last lap but talked incessantly during walk and sats still 94% at end  -  03/31/21  Best fit eval:  Sat > 90% on 3 Pulsed > rec refillable tanks per [facility]      COPD exacerbation (HCC)   Dyspnea on exertion   Chronic respiratory failure with hypoxia (HCC)   See admit 04/07/20  -  04/21/2020   Walked RA  2 laps @ approx 23ft each @ avg pace  stopped due to end of study,  Sob on last lap but talked incessantly during walk and sats still 94% at end  -  03/31/21  Best fit eval:  Sat > 90% on 3 Pulsed > rec refillable tanks per [facility] -  05/20/2021   Walked on 2lpm cont x 1  lap(s) =  approx 250 ft  @ moderate  pace, stopped due to sob  with lowest 02 sats 93%        Pulmonary infiltrates   CT chest 06/18/21 1. No findings to suggest interstitial lung disease. 2. Diffuse bronchial wall thickening with severe centrilobular and paraseptal emphysema; imaging findings suggestive of underlying COPD. 3.  Aortic atherosclerosis, in addition to left main and three-vessel coronary artery disease 4. Ectasia of ascending thoracic aorta (4.1 cm in diameter).  5. There are calcifications of the aortic valve. Echocardiographic correlation for evaluation of potential valvular dysfunction may be warranted if clinically indicated.>  3/4/5 referred to [facility], his cardiologist       Centrilobular emphysema (HCC)    Allergies  Allergen Reactions   Lipitor [Atorvastatin] Other (See Comments)    Myalgias   Sulfonamide Derivatives Other (See Comments)    Childhood allergy unknown reaction     Immunization History  Administered Date(s) Administered   Influenza, Quadrivalent, Recombinant, Inj, Pf 03/03/2017, 02/09/2018, 02/20/2019, 03/03/2020, 03/16/2021   Influenza-Unspecified 03/07/2012, 04/01/2014, 02/25/2015, 04/06/2016, 03/03/2020   PFIZER(Purple Top)SARS-COV-2 Vaccination 07/28/2019, 08/20/2019, 03/28/2020   Pneumococcal Conjugate-13 01/28/2016   Pneumococcal Polysaccharide-23 11/27/2013   Td 01/01/2007   Tdap 08/31/2011   Zoster, Live 01/12/2015    Past Medical History:  Diagnosis Date   Bursitis    Carotid atherosclerosis    COPD (chronic obstructive pulmonary disease) (HCC)    Hyperlipidemia, group A 07/21/2018   Libido, decreased    Low back pain    Plantar wart    Screening for abdominal aortic aneurysm (AAA) performed 07/21/2018   Abdominal aortic duplex 01/04/2018: The maximum aorta diameter is 2.48 cm (prox).  Ectasia of the abdominal aorta measuring 2.48 x 2.4 x 2.47 cm is seen.  Focal plaque observed in the proximal, mid and distal aorta. Peak systolic velocities in the left internal iliac artery are mildly increased suggests <50% stenosis. Enlarged inferior vena cava with normal respiratory variation suggests elevated     Tobacco History: Social History   Tobacco Use  Smoking Status Former   Current packs/day: 0.00   Average packs/day: 1 pack/day for 48.0 years (48.0 ttl  pk-yrs)   Types: Cigarettes   Start date: 03/10/1972   Quit date: 03/10/2020   Years since quitting: 3.7  Smokeless Tobacco Never   Counseling given: Not Answered   Continue to not smoke  Outpatient Encounter Medications as of 12/12/2023  Medication Sig   albuterol  (PROVENTIL ) (2.5 MG/3ML) 0.083% nebulizer solution Take 3 mLs (2.5 mg total) by nebulization every 4 (four) hours as needed for wheezing or shortness of breath.   albuterol  (VENTOLIN  HFA) 108 (90 Base) MCG/ACT inhaler Inhale 2 puffs into the lungs every 4 (four) hours as needed for wheezing or shortness of breath.   ALPRAZolam  (XANAX ) 0.25 MG tablet Take 0.25 mg by mouth 2 (two) times daily as needed for anxiety.   aspirin  81 MG tablet Take 81 mg by mouth daily.   CRESTOR  20 MG tablet Take 20 mg by mouth daily.    ezetimibe  (ZETIA ) 10 MG tablet Take 10 mg by mouth daily.   Glycopyrrolate-Formoterol (BEVESPI  AEROSPHERE) 9-4.8 MCG/ACT AERO Inhale 2 puffs into the lungs 2 (two) times daily.   hydrochlorothiazide  (MICROZIDE ) 12.5 MG capsule Take 12.5 mg by mouth daily.   ibuprofen  (ADVIL ) 200 MG tablet Take 400 mg by mouth every 6 (six) hours as needed for mild pain, moderate pain or headache.   irbesartan  (AVAPRO ) 150 MG tablet Take 150 mg by mouth daily after supper.    OXYGEN  Inhale 2 L into the lungs See admin instructions. Continuously   predniSONE  (DELTASONE ) 10 MG tablet Take 1 tablet (10 mg total) by mouth daily. Take 4 pills daily for 3 days then 2 pills daily for 3 days then 1 pill daily for 3 days then stop   sertraline  (  ZOLOFT ) 50 MG tablet Take 25-50 mg by mouth See admin instructions. Take 1/2 tablet (25 mg) by mouth daily for 4 days then take 1 whole tablet (50 mg) daily   [DISCONTINUED] albuterol  (VENTOLIN  HFA) 108 (90 Base) MCG/ACT inhaler Inhale 2 puffs into the lungs every 4 (four) hours as needed for wheezing or shortness of breath.   [DISCONTINUED] Glycopyrrolate-Formoterol (BEVESPI  AEROSPHERE) 9-4.8 MCG/ACT AERO  Inhale 2 puffs into the lungs 2 (two) times daily.   No facility-administered encounter medications on file as of 12/12/2023.     Review of Systems  Review of Systems  No chest pain with exertion.  No orthopnea or PND.  No lower extremity swelling.  Comprehensive review of systems otherwise negative. Physical Exam  BP (!) 159/80 (BP Location: Left Arm, Patient Position: Sitting, Cuff Size: Normal)   Pulse 96   Ht 5' 10 (1.778 m)   Wt 129 lb (58.5 kg)   SpO2 94%   BMI 18.51 kg/m   Wt Readings from Last 5 Encounters:  12/12/23 129 lb (58.5 kg)  04/01/22 150 lb (68 kg)  03/15/22 140 lb (63.5 kg)  03/01/22 143 lb (64.9 kg)  08/09/21 140 lb (63.5 kg)    BMI Readings from Last 5 Encounters:  12/12/23 18.51 kg/m  04/01/22 21.52 kg/m  03/15/22 20.09 kg/m  03/01/22 20.52 kg/m  08/09/21 20.67 kg/m     Physical Exam General: Thin, sitting in chair Eyes: EOMI, no icterus Neck: Supple, no JVP Pulmonary: Distant, mild wheeze throughout Cardiovascular: Warm, no edema Abdomen: Nondistended, bowel sounds present MSK: No synovitis, no joint effusion Neuro: Normal gait, no weakness Psych: Normal mood, full affect  Assessment & Plan:   Severe COPD: Based on PFTs 2016.  With hyperinflation.  Anoro not helpful.  On Bevespi  - refilled today.  Recommended using albuterol  nebs at home for rescue and continue HFA use when outside the home.    Chronic hypoxemic respiratory failure: In setting of emphysema, COPD.  He was encouraged to keep his oxygen  saturation 88% or higher.     Dyspnea on exertion: Likely multifactorial related to severe COPD, valvular disease, deconditioning.   Tobacco abuse in remission: Qualifies for lung cancer screening based on age and pack years and quit 3 years ago.  Has lapsed lung cancer screening.  Referral to lung cancer screening program sent today.   Return in about 1 year (around 12/11/2024) for f/u Dr. Annella.   Donnice JONELLE Annella,  MD 12/12/2023  I spent 41 minutes in the care of the patient during face-to-face visit, review of records, coordination of care.

## 2023-12-12 NOTE — Addendum Note (Signed)
 Addended by: ARMAND SOR R on: 12/12/2023 04:07 PM   Modules accepted: Orders

## 2024-01-02 DIAGNOSIS — J9611 Chronic respiratory failure with hypoxia: Secondary | ICD-10-CM | POA: Diagnosis not present

## 2024-01-03 ENCOUNTER — Other Ambulatory Visit: Payer: Self-pay | Admitting: Emergency Medicine

## 2024-01-03 DIAGNOSIS — I10 Essential (primary) hypertension: Secondary | ICD-10-CM | POA: Diagnosis not present

## 2024-01-03 DIAGNOSIS — Z122 Encounter for screening for malignant neoplasm of respiratory organs: Secondary | ICD-10-CM

## 2024-01-03 DIAGNOSIS — F32A Depression, unspecified: Secondary | ICD-10-CM | POA: Diagnosis not present

## 2024-01-03 DIAGNOSIS — Z87891 Personal history of nicotine dependence: Secondary | ICD-10-CM

## 2024-01-10 ENCOUNTER — Inpatient Hospital Stay
Admission: RE | Admit: 2024-01-10 | Discharge: 2024-01-10 | Disposition: A | Discharge status: Home / Self Care | Source: Ambulatory Visit | Attending: Acute Care | Admitting: Acute Care

## 2024-01-10 DIAGNOSIS — J439 Emphysema, unspecified: Secondary | ICD-10-CM | POA: Diagnosis not present

## 2024-01-10 DIAGNOSIS — Z87891 Personal history of nicotine dependence: Secondary | ICD-10-CM | POA: Diagnosis not present

## 2024-01-10 DIAGNOSIS — I700 Atherosclerosis of aorta: Secondary | ICD-10-CM | POA: Diagnosis not present

## 2024-01-10 DIAGNOSIS — Z122 Encounter for screening for malignant neoplasm of respiratory organs: Secondary | ICD-10-CM | POA: Diagnosis not present

## 2024-01-23 ENCOUNTER — Other Ambulatory Visit: Payer: Self-pay

## 2024-01-23 DIAGNOSIS — Z87891 Personal history of nicotine dependence: Secondary | ICD-10-CM

## 2024-01-23 DIAGNOSIS — Z122 Encounter for screening for malignant neoplasm of respiratory organs: Secondary | ICD-10-CM

## 2024-01-26 ENCOUNTER — Telehealth: Payer: Self-pay | Admitting: *Deleted

## 2024-01-26 NOTE — Telephone Encounter (Signed)
 Copied from CRM #[id number]. Topic: Clinical - Order For Equipment >> Jan 25, 2024  1:37 PM Celestine FALCON wrote: Reason for CRM: Kat from [facility] is calling to verify if the prescription for oxygen  was received by Dr. Annella. This was faxed over from [place] on 01/22/2024.  Kat's phone number is [phone] and her fax number is [phone].   Called the number given and unable to reach anyone- msg kept repeating about special offer and asking if I am over 18 y/o   I tried to enter the ext number provided, but it was only 4 digits and should have been 5  Closing encounter

## 2024-01-31 ENCOUNTER — Telehealth: Payer: Self-pay

## 2024-01-31 NOTE — Telephone Encounter (Signed)
 Received signed CMN from [facility]. Faxed to [facility] at [phone] Received fax confirmation. NFN.

## 2024-02-02 DIAGNOSIS — J9611 Chronic respiratory failure with hypoxia: Secondary | ICD-10-CM | POA: Diagnosis not present

## 2024-02-16 ENCOUNTER — Telehealth: Payer: Self-pay

## 2024-02-16 ENCOUNTER — Telehealth: Payer: Self-pay | Admitting: Pulmonary Disease

## 2024-02-16 NOTE — Telephone Encounter (Signed)
 Copied from CRM [id number]. Topic: Clinical - Order For Equipment >> Feb 16, 2024 10:17 AM Russell PARAS wrote: Reason for CRM:   Nathanel, w/[facility], is contacting clinic regarding SWO sent by clinic for oxygen  supplies. She reports there is a large white line going across the form and they will need the SWO resent. Nathanel reports they sent a request on 9/2 by fax.   FX#  [phone]

## 2024-02-16 NOTE — Telephone Encounter (Signed)
 Received CMN from [facility]. Placed in Hunsucker's sign folder in A Pod. Once signed, will fax back to [facility] at [phone]

## 2024-02-20 NOTE — Telephone Encounter (Signed)
 Duplicate- see other phone encounter dated 02/16/24

## 2024-03-05 ENCOUNTER — Telehealth: Payer: Self-pay

## 2024-03-05 NOTE — Telephone Encounter (Signed)
 CMN received from [facility] for O2.  CMN has been placed in Dr. Leatha folder.

## 2024-03-27 ENCOUNTER — Ambulatory Visit: Admitting: Cardiology

## 2024-03-28 ENCOUNTER — Encounter: Payer: Self-pay | Admitting: Cardiology

## 2024-03-29 ENCOUNTER — Ambulatory Visit: Admitting: Cardiology

## 2024-03-29 NOTE — Telephone Encounter (Signed)
CMN has been faxed 

## 2024-04-23 ENCOUNTER — Telehealth: Payer: Self-pay

## 2024-04-23 NOTE — Telephone Encounter (Signed)
 Signed Paperwork faxed to [employer]

## 2024-05-04 DIAGNOSIS — J9611 Chronic respiratory failure with hypoxia: Secondary | ICD-10-CM | POA: Diagnosis not present

## 2024-06-03 DIAGNOSIS — J9611 Chronic respiratory failure with hypoxia: Secondary | ICD-10-CM | POA: Diagnosis not present
